# Patient Record
Sex: Female | Born: 1980 | Race: Black or African American | Hispanic: No | Marital: Married | Smoking: Never smoker
Health system: Southern US, Community
[De-identification: ages and names within clinical notes are randomized; demographics above are authoritative.]

---

## 2011-06-03 ENCOUNTER — Other Ambulatory Visit (HOSPITAL_COMMUNITY)
Admission: RE | Admit: 2011-06-03 | Discharge: 2011-06-03 | Disposition: A | Discharge status: Home / Self Care | Payer: Managed Care, Other (non HMO) | Source: Ambulatory Visit | Attending: Obstetrics and Gynecology | Admitting: Obstetrics and Gynecology

## 2011-06-03 DIAGNOSIS — Z124 Encounter for screening for malignant neoplasm of cervix: Secondary | ICD-10-CM | POA: Insufficient documentation

## 2012-03-27 ENCOUNTER — Encounter (HOSPITAL_BASED_OUTPATIENT_CLINIC_OR_DEPARTMENT_OTHER): Payer: Self-pay | Admitting: *Deleted

## 2012-03-27 ENCOUNTER — Emergency Department (HOSPITAL_BASED_OUTPATIENT_CLINIC_OR_DEPARTMENT_OTHER)
Admission: EM | Admit: 2012-03-27 | Discharge: 2012-03-28 | Disposition: A | Discharge status: Home / Self Care | Payer: Managed Care, Other (non HMO) | Attending: Emergency Medicine | Admitting: Emergency Medicine

## 2012-03-27 DIAGNOSIS — R509 Fever, unspecified: Secondary | ICD-10-CM | POA: Insufficient documentation

## 2012-03-27 NOTE — ED Notes (Signed)
Pt. Reports feeling bad since Thurs.  No reports of nausea vomiting or diarrhea.

## 2012-03-28 ENCOUNTER — Emergency Department (HOSPITAL_BASED_OUTPATIENT_CLINIC_OR_DEPARTMENT_OTHER): Payer: Managed Care, Other (non HMO)

## 2012-03-28 LAB — BASIC METABOLIC PANEL
BUN: 10 mg/dL (ref 6–23)
GFR calc Af Amer: >90 mL/min (ref >90)
GFR calc non Af Amer: >90 mL/min (ref >90)
Potassium: 4.2 mEq/L (ref 3.5–5.1)

## 2012-03-28 LAB — CBC WITH DIFFERENTIAL/PLATELET
Basophils Absolute: 0 10*3/uL (ref 0.0–0.1)
Basophils Relative: 0 % (ref 0–1)
Eosinophils Absolute: 0.4 10*3/uL (ref 0.0–0.7)
Hemoglobin: 13.2 g/dL (ref 12.0–15.0)
MCH: 32.8 pg (ref 26.0–34.0)
MCHC: 35.4 g/dL (ref 30.0–36.0)
Monocytes Relative: 10 % (ref 3–12)
Neutrophils Relative %: 45 % (ref 43–77)
Platelets: 167 10*3/uL (ref 150–400)
RDW: 11.5 % (ref 11.5–15.5)

## 2012-03-28 LAB — URINE MICROSCOPIC-ADD ON

## 2012-03-28 LAB — URINALYSIS, ROUTINE W REFLEX MICROSCOPIC
Ketones, ur: NEGATIVE mg/dL
Nitrite: NEGATIVE
Protein, ur: NEGATIVE mg/dL
Urobilinogen, UA: 1 mg/dL (ref 0.0–1.0)

## 2012-03-28 LAB — ROCKY MTN SPOTTED FVR AB, IGM-BLOOD: RMSF IgM: 0.25 IV (ref 0.00–0.89)

## 2012-03-28 MED ORDER — SODIUM CHLORIDE 0.9 % IV BOLUS (SEPSIS)
1000.0000 mL | Freq: Once | INTRAVENOUS | Status: AC
  Administered 2012-03-28: 1000 mL via INTRAVENOUS

## 2012-03-28 MED ORDER — IBUPROFEN 800 MG PO TABS
800.0000 mg | ORAL_TABLET | Freq: Once | ORAL | Status: AC
  Administered 2012-03-28: 800 mg via ORAL
  Filled 2012-03-28: qty 1

## 2012-03-28 MED ORDER — ACETAMINOPHEN 325 MG PO TABS
650.0000 mg | ORAL_TABLET | Freq: Once | ORAL | Status: AC
  Administered 2012-03-28: 650 mg via ORAL
  Filled 2012-03-28: qty 2

## 2012-03-28 MED ORDER — DOXYCYCLINE HYCLATE 100 MG PO TABS
100.0000 mg | ORAL_TABLET | Freq: Once | ORAL | Status: AC
  Administered 2012-03-28: 100 mg via ORAL
  Filled 2012-03-28: qty 1

## 2012-03-28 MED ORDER — DOXYCYCLINE HYCLATE 100 MG PO TABS: 100.0000 mg | ORAL_TABLET | Freq: Two times a day (BID) | ORAL | Status: AC

## 2012-03-28 NOTE — ED Provider Notes (Signed)
History     CSN: [id number]  Arrival date & time 03/27/12  2336   First MD Initiated Contact with Patient 03/27/12 2341      Chief Complaint  Patient presents with  . Generalized Body Aches    Pt. reports pain all over and has chills with reports she saw her family MD for same.  Pt. reports hives from Diflonc Sodium    (Consider location/radiation/quality/duration/timing/severity/associated sxs/prior treatment) HPI Pt reports 4 days of intermittent fever and moderate to severe generalized body aches and headache. She went to her PCP for same 2 days ago and had neg strep and influenza tests. Given Voltaren for symptoms which helped some but caused a hives rash. She took some benadryl and the hives went away. She denies any URI symptoms, no cough, SOB, N/V/D, dysuria or vaginal discharge.   History reviewed. No pertinent past medical history.  History reviewed. No pertinent past surgical history.  No family history on file.  History  Substance Use Topics  . Smoking status: Never Smoker   . Smokeless tobacco: Not on file  . Alcohol Use: Yes    OB History    Grav Para Term Preterm Abortions TAB SAB Ect Mult Living                  Review of Systems All other systems reviewed and are negative except as noted in HPI.   Allergies  Diclofenac; Erythromycin; and Hydrocodone  Home Medications   Current Outpatient Rx  Name Route Sig Dispense Refill  . DIPHENHYDRAMINE HCL 25 MG PO TABS Oral Take 25 mg by mouth every 6 (six) hours as needed.    Marland Kitchen LEVONORGEST-ETH ESTRAD 91-DAY 0.15-0.03 MG PO TABS Oral Take 1 tablet by mouth daily.      BP 141/84  Pulse 107  Temp 101.1 F (38.4 C) (Oral)  Resp 20  Ht 5\' 5"  (1.651 m)  Wt 220 lb (99.791 kg)  BMI 36.61 kg/m2  SpO2 100%  LMP 02/21/2012  Physical Exam  Nursing note and vitals reviewed. Constitutional: She is oriented to person, place, and time. She appears well-developed and well-nourished.  HENT:  Head: Normocephalic  and atraumatic.  Eyes: EOM are normal. Pupils are equal, round, and reactive to light.  Neck: Normal range of motion. Neck supple.       No meningismus  Cardiovascular: Normal rate, normal heart sounds and intact distal pulses.   Pulmonary/Chest: Effort normal and breath sounds normal.  Abdominal: Bowel sounds are normal. She exhibits no distension. There is no tenderness.  Musculoskeletal: Normal range of motion. She exhibits no edema and no tenderness.  Neurological: She is alert and oriented to person, place, and time. She has normal strength. No cranial nerve deficit or sensory deficit.  Skin: Skin is warm and dry. No rash noted.  Psychiatric: She has a normal mood and affect.    ED Course  Procedures (including critical care time)  Labs Reviewed  URINALYSIS, ROUTINE W REFLEX MICROSCOPIC - Abnormal; Notable for the following:    APPearance CLOUDY (*)     Leukocytes, UA SMALL (*)     All other components within normal limits  CBC WITH DIFFERENTIAL - Abnormal; Notable for the following:    WBC 3.4 (*)     Neutro Abs 1.5 (*)     Eosinophils Relative 11 (*)     All other components within normal limits  BASIC METABOLIC PANEL - Abnormal; Notable for the following:    Sodium 133 (*)  Glucose, Bld 112 (*)     Calcium 8.3 (*)     All other components within normal limits  URINE MICROSCOPIC-ADD ON - Abnormal; Notable for the following:    Squamous Epithelial / LPF FEW (*)     Bacteria, UA FEW (*)     All other components within normal limits  PREGNANCY, URINE  CK  ROCKY MTN SPOTTED FVR AB, IGG-BLOOD  ROCKY MTN SPOTTED FVR AB, IGM-BLOOD   Dg Chest 2 View  03/28/2012  *RADIOLOGY REPORT*  Clinical Data: Body aches and fever.  CHEST - 2 VIEW  Comparison: None.  Findings: The heart size and mediastinal contours are normal. The lungs are clear. There is no pleural effusion or pneumothorax. No acute osseous findings are identified.  IMPRESSION: No active cardiopulmonary process.    Original Report Authenticated By: Gerrianne Scale, M.D.      No diagnosis found.    MDM  Fever and headache without clear source in summertime. Consider RMSF, titers sent but will treat empirically. Pt given Motrin in controlled environment without reaction. Doubt true NSAID allergy.        Charles B. Bernette Mayers, MD 03/28/12 [phone]

## 2012-04-06 ENCOUNTER — Encounter (HOSPITAL_BASED_OUTPATIENT_CLINIC_OR_DEPARTMENT_OTHER): Payer: Self-pay | Admitting: Emergency Medicine

## 2012-04-06 ENCOUNTER — Emergency Department (HOSPITAL_BASED_OUTPATIENT_CLINIC_OR_DEPARTMENT_OTHER)
Admission: EM | Admit: 2012-04-06 | Discharge: 2012-04-06 | Disposition: A | Discharge status: Other Acute Inpatient Hospital | Payer: Managed Care, Other (non HMO) | Attending: Emergency Medicine | Admitting: Emergency Medicine

## 2012-04-06 ENCOUNTER — Emergency Department (HOSPITAL_BASED_OUTPATIENT_CLINIC_OR_DEPARTMENT_OTHER): Payer: Managed Care, Other (non HMO)

## 2012-04-06 DIAGNOSIS — A419 Sepsis, unspecified organism: Secondary | ICD-10-CM

## 2012-04-06 DIAGNOSIS — H109 Unspecified conjunctivitis: Secondary | ICD-10-CM | POA: Insufficient documentation

## 2012-04-06 LAB — COMPREHENSIVE METABOLIC PANEL
Alkaline Phosphatase: 69 U/L (ref 39–117)
BUN: 13 mg/dL (ref 6–23)
Chloride: 103 mEq/L (ref 96–112)
GFR calc Af Amer: >90 mL/min (ref >90)
Glucose, Bld: 143 mg/dL — ABNORMAL HIGH (ref 70–99)
Potassium: 3 mEq/L — ABNORMAL LOW (ref 3.5–5.1)
Total Bilirubin: 0.5 mg/dL (ref 0.3–1.2)

## 2012-04-06 LAB — URINALYSIS, ROUTINE W REFLEX MICROSCOPIC
Glucose, UA: NEGATIVE mg/dL
Ketones, ur: 15 mg/dL — AB
Nitrite: NEGATIVE
Protein, ur: NEGATIVE mg/dL

## 2012-04-06 LAB — CBC WITH DIFFERENTIAL/PLATELET
Hemoglobin: 14.3 g/dL (ref 12.0–15.0)
Lymphs Abs: 0.2 10*3/uL — ABNORMAL LOW (ref 0.7–4.0)
Monocytes Relative: 3 % (ref 3–12)
Neutro Abs: 7.7 10*3/uL (ref 1.7–7.7)
Neutrophils Relative %: 95 % — ABNORMAL HIGH (ref 43–77)
RBC: 4.39 MIL/uL (ref 3.87–5.11)

## 2012-04-06 LAB — URINE MICROSCOPIC-ADD ON

## 2012-04-06 LAB — WET PREP, GENITAL: Clue Cells Wet Prep HPF POC: NONE SEEN

## 2012-04-06 LAB — LACTIC ACID, PLASMA: Lactic Acid, Venous: 3.5 mmol/L — ABNORMAL HIGH (ref 0.5–2.2)

## 2012-04-06 LAB — CK: Total CK: 86 U/L (ref 7–177)

## 2012-04-06 LAB — MONONUCLEOSIS SCREEN: Mono Screen: NEGATIVE

## 2012-04-06 LAB — PREGNANCY, URINE: Preg Test, Ur: NEGATIVE

## 2012-04-06 LAB — RAPID STREP SCREEN (MED CTR MEBANE ONLY): Streptococcus, Group A Screen (Direct): NEGATIVE

## 2012-04-06 MED ORDER — IBUPROFEN 800 MG PO TABS
800.0000 mg | ORAL_TABLET | Freq: Once | ORAL | Status: AC
  Administered 2012-04-06: 800 mg via ORAL
  Filled 2012-04-06: qty 1

## 2012-04-06 MED ORDER — VANCOMYCIN HCL IN DEXTROSE 1-5 GM/200ML-% IV SOLN
1000.0000 mg | Freq: Once | INTRAVENOUS | Status: AC
  Administered 2012-04-06: 1000 mg via INTRAVENOUS
  Filled 2012-04-06: qty 200

## 2012-04-06 MED ORDER — POTASSIUM CHLORIDE 10 MEQ/100ML IV SOLN
10.0000 meq | Freq: Once | INTRAVENOUS | Status: AC
  Administered 2012-04-06: 10 meq via INTRAVENOUS
  Filled 2012-04-06: qty 100

## 2012-04-06 MED ORDER — SODIUM CHLORIDE 0.9 % IV BOLUS (SEPSIS)
1000.0000 mL | Freq: Once | INTRAVENOUS | Status: AC
  Administered 2012-04-06: 1000 mL via INTRAVENOUS

## 2012-04-06 MED ORDER — PIPERACILLIN-TAZOBACTAM 3.375 G IVPB
3.3750 g | Freq: Once | INTRAVENOUS | Status: AC
  Administered 2012-04-06: 3.375 g via INTRAVENOUS
  Filled 2012-04-06: qty 50

## 2012-04-06 NOTE — ED Notes (Signed)
Patient with generalized aches and malaise, presented last week with same symptoms, started on doxy  For ? Tick bite, pt had rash to doxy and was discontinued by current pcp,

## 2012-04-06 NOTE — ED Provider Notes (Signed)
History     CSN: [id number]  Arrival date & time 04/06/12  0530   First MD Initiated Contact with Patient 04/06/12 0546      No chief complaint on file.   (Consider location/radiation/quality/duration/timing/severity/associated sxs/prior treatment) Patient is a 31 y.o. female presenting with musculoskeletal pain and pharyngitis. The history is provided by the patient.  Muscle Pain This is a new problem. The current episode started more than 1 week ago (2 weeks ago). The problem occurs constantly. Progression since onset: got better then got worse again.  Tested for RMSF, Flu and strep without source. Pertinent negatives include no chest pain, no abdominal pain and no shortness of breath. Nothing aggravates the symptoms. Nothing relieves the symptoms. Treatments tried: Has had doxycycline, voltaren and bactrim all which made her sick. The treatment provided no relief.  Sore Throat This is a new problem. The current episode started 6 to 12 hours ago. The problem occurs constantly. The problem has not changed since onset.Pertinent negatives include no chest pain, no abdominal pain and no shortness of breath. Nothing aggravates the symptoms. Nothing relieves the symptoms. She has tried nothing for the symptoms. The treatment provided no relief.  Today has a mild HA.  But has a daily headaches and this is mild in comparison to previous. Pt reports this am sclera are injected and has mild sore throat.  No vaginal symptom.  No CP.  No rashes since stopping voltaren.  No vomiting.  No diarrhea.  No abdominal or vaginal complaints.  No joint swelling.  No tick exposure.  No travel history.  No family h/o of hematologic problems.    No past medical history on file.  No past surgical history on file.  No family history on file.  History  Substance Use Topics  . Smoking status: Never Smoker   . Smokeless tobacco: Not on file  . Alcohol Use: Yes    OB History    Grav Para Term Preterm Abortions  TAB SAB Ect Mult Living                  Review of Systems  Constitutional: Negative for fever.  HENT: Positive for sore throat. Negative for facial swelling, neck pain and neck stiffness.   Eyes: Positive for redness. Negative for photophobia.  Respiratory: Negative for cough and shortness of breath.   Cardiovascular: Negative for chest pain.  Gastrointestinal: Negative for nausea, vomiting, abdominal pain and diarrhea.  Genitourinary: Negative for dysuria, vaginal discharge and pelvic pain.  Skin: Negative for color change and rash.  Neurological: Negative for weakness and light-headedness.  Hematological: Negative for adenopathy.  All other systems reviewed and are negative.    Allergies  Diclofenac; Erythromycin; and Hydrocodone  Home Medications   Current Outpatient Rx  Name Route Sig Dispense Refill  . DIPHENHYDRAMINE HCL 25 MG PO TABS Oral Take 25 mg by mouth every 6 (six) hours as needed.    Marland Kitchen DOXYCYCLINE HYCLATE 100 MG PO TABS Oral Take 1 tablet (100 mg total) by mouth 2 (two) times daily. 20 tablet 0  . LEVONORGEST-ETH ESTRAD 91-DAY 0.15-0.03 MG PO TABS Oral Take 1 tablet by mouth daily.      BP 101/58  Pulse 140  Temp 99.4 F (37.4 C) (Oral)  Resp 18  Ht 5\' 5"  (1.651 m)  Wt 220 lb (99.791 kg)  BMI 36.61 kg/m2  SpO2 100%  LMP 02/21/2012  Physical Exam  Constitutional: She is oriented to person, place, and time. She appears  well-developed and well-nourished.  HENT:  Head: Normocephalic and atraumatic.  Mouth/Throat: Oropharynx is clear and moist.  Eyes: Pupils are equal, round, and reactive to light. Right conjunctiva is injected. Left conjunctiva is injected.  Neck: Normal range of motion. Neck supple.  Cardiovascular: Regular rhythm.  Tachycardia present.   Pulmonary/Chest: Effort normal. She has no wheezes. She exhibits no tenderness.  Abdominal: Soft. Bowel sounds are normal. There is no tenderness.  Musculoskeletal: Normal range of motion. She  exhibits no edema.  Lymphadenopathy:    She has no cervical adenopathy.  Neurological: She is alert and oriented to person, place, and time. She has normal reflexes.  Skin: Skin is warm and dry.  Psychiatric: She has a normal mood and affect.    ED Course  Procedures (including critical care time)   Labs Reviewed  CBC WITH DIFFERENTIAL  COMPREHENSIVE METABOLIC PANEL  URINALYSIS, ROUTINE W REFLEX MICROSCOPIC  URINE CULTURE  CULTURE, BLOOD (ROUTINE X 2)  CULTURE, BLOOD (ROUTINE X 2)  LACTIC ACID, PLASMA  MONONUCLEOSIS SCREEN  RAPID STREP SCREEN   No results found.   No diagnosis found.    MDM  MDM Reviewed: previous chart, nursing note and vitals Reviewed previous: labs and x-ray Interpretation: labs, ECG and x-ray Total time providing critical care: 30-74 minutes. This excludes time spent performing separately reportable procedures and services. Consults: admitting MD  CRITICAL CARE   Performed by: Jasmine Awe   Total critical care time: 60 minutes  Critical care time was exclusive of separately billable procedures and treating other patients.  Critical care was necessary to treat or prevent imminent or life-threatening deterioration.  Critical care was time spent personally by me on the following activities: development of treatment plan with patient and/or surrogate as well as nursing, discussions with consultants, evaluation of patient's response to treatment, examination of patient, obtaining history from patient or surrogate, ordering and performing treatments and interventions, ordering and review of laboratory studies, ordering and review of radiographic studies, pulse oximetry and re-evaluation of patient's condition.     DDX: occult bacteremia from bacteria source, viral infection.  Suspect rheumatologic process that is undiagnosed.  Will need admission IVF and abx until cultures negative   ED EKG . Date: 04/06/2012  Rate: 135  Rhythm:  sinus tachycardia  QRS Axis: normal  Intervals: QT prolonged  ST/T Wave abnormalities: nonspecific T wave changes  Conduction Disutrbances:none  Narrative Interpretation:   Old EKG Reviewed: none available  RMSF labs sent on last visit and were below .9 level  Blood cultures sent x 2.  Added CK per Dr. Linard Millers.  Vancomycin and zosyn IV initiated.  Bolus 3 liters IVF.  No QT prolonging agents d/t QTc prolongation.       Jasmine Awe, MD 04/06/12 [phone]

## 2012-04-08 LAB — URINE CULTURE: Colony Count: NO GROWTH

## 2012-04-10 ENCOUNTER — Telehealth: Payer: Self-pay | Admitting: Hematology & Oncology

## 2012-04-10 NOTE — Telephone Encounter (Signed)
Pt made 04-17-12 appointment

## 2012-04-10 NOTE — Telephone Encounter (Signed)
Let message for pt to call and schedule appointment

## 2012-04-12 LAB — CULTURE, BLOOD (ROUTINE X 2): Culture: NO GROWTH

## 2012-04-18 ENCOUNTER — Ambulatory Visit: Payer: Managed Care, Other (non HMO)

## 2012-04-18 ENCOUNTER — Ambulatory Visit: Payer: Managed Care, Other (non HMO) | Admitting: Medical

## 2012-04-18 ENCOUNTER — Ambulatory Visit: Payer: Managed Care, Other (non HMO) | Admitting: Lab

## 2012-04-19 ENCOUNTER — Telehealth: Payer: Self-pay | Admitting: Hematology & Oncology

## 2012-04-19 NOTE — Telephone Encounter (Signed)
Left pt message to call if she wants to reschedule missed 9-24 appointment

## 2012-05-10 ENCOUNTER — Telehealth: Payer: Self-pay | Admitting: Hematology & Oncology

## 2012-05-10 NOTE — Telephone Encounter (Signed)
Pt called schedule 05-18-12 appointment

## 2012-05-16 ENCOUNTER — Other Ambulatory Visit: Payer: Self-pay | Admitting: Medical

## 2012-05-18 ENCOUNTER — Ambulatory Visit: Payer: Managed Care, Other (non HMO) | Admitting: Medical

## 2012-05-18 ENCOUNTER — Ambulatory Visit: Payer: Managed Care, Other (non HMO)

## 2012-05-18 ENCOUNTER — Other Ambulatory Visit: Payer: Managed Care, Other (non HMO) | Admitting: Lab

## 2012-05-22 ENCOUNTER — Other Ambulatory Visit: Payer: Managed Care, Other (non HMO) | Admitting: Lab

## 2012-05-22 ENCOUNTER — Ambulatory Visit: Payer: Managed Care, Other (non HMO)

## 2012-05-22 ENCOUNTER — Ambulatory Visit (HOSPITAL_BASED_OUTPATIENT_CLINIC_OR_DEPARTMENT_OTHER): Payer: Managed Care, Other (non HMO) | Admitting: Medical

## 2012-05-22 VITALS — BP 117/68 | HR 79 | Temp 98.1°F | Resp 16 | Ht 68.0 in | Wt 215.0 lb

## 2012-05-22 DIAGNOSIS — D72819 Decreased white blood cell count, unspecified: Secondary | ICD-10-CM

## 2012-05-22 LAB — COMPREHENSIVE METABOLIC PANEL
Albumin: 3.8 g/dL (ref 3.5–5.2)
BUN: 11 mg/dL (ref 6–23)
Calcium: 9.2 mg/dL (ref 8.4–10.5)
Chloride: 107 mEq/L (ref 96–112)
Glucose, Bld: 88 mg/dL (ref 70–99)
Potassium: 4 mEq/L (ref 3.5–5.3)
Sodium: 139 mEq/L (ref 135–145)
Total Protein: 6.8 g/dL (ref 6.0–8.3)

## 2012-05-22 LAB — CBC WITH DIFFERENTIAL (CANCER CENTER ONLY)
BASO#: 0 10*3/uL (ref 0.0–0.2)
Eosinophils Absolute: 0.3 10*3/uL (ref 0.0–0.5)
HCT: 38.6 % (ref 34.8–46.6)
HGB: 13.5 g/dL (ref 11.6–15.9)
LYMPH#: 1.1 10*3/uL (ref 0.9–3.3)
MONO#: 0.6 10*3/uL (ref 0.1–0.9)
NEUT%: 39 % — ABNORMAL LOW (ref 39.6–80.0)
RBC: 4.08 10*6/uL (ref 3.70–5.32)
WBC: 3.2 10*3/uL — ABNORMAL LOW (ref 3.9–10.0)

## 2012-05-22 NOTE — Progress Notes (Signed)
This office note has been dictated.

## 2012-05-22 NOTE — H&P (Signed)
[facility] NEW PATIENT EVALUATION   Name: Jessica Mcgrath Date: 05/22/2012 MRN: [id number] DOB: [birth year]    REFERRING PHYSICIAN: Palumbo-Rasch, April, MD  REASON FOR REFERRAL:  Leukopenia   HISTORY OF PRESENT ILLNESS:Jessica Mcgrath is a 31 y.o. female who was kindly referred to [place] by [facility].  This is a pleasant, African American, female, who really does not have any pertinent past medical or surgical history.  Overall, she, reports, that she is in good health.  She did have a short hospital admission.  Back on September 13 at [facility] for possible sepsis.  She reports, that she presented to the emergency room with severe, achiness, and fever.  She reports, that they tested her for the flu, and it was negative.  She was then [facility] for admission.  I do not have those records, however, from what the patient tells me all workup was negative.  She's not reported any recent upper respiratory infections.  She's not had any type of unintentional weight loss.  She's not reporting any type of bony pain, or abdominal pain.  She denies any fevers, chills, or night sweats.  Any cough, chest pain, shortness of breath.  She denies any nausea, vomiting, diarrhea, or constipation.  She denies any type of dysphasia or odynophagia.  She denies any headaches, visual changes, or rashes. I did explain to the, patient that about 10-15% of the African American population has a low white count.  PAST MEDICAL HISTORY:  No pertinent PMH  PAST SURGICAL HISTORY: No pertinent PSH  ALLERGIES: Diclofenac; Erythromycin; and Hydrocodone  SOCIAL HISTORY: She is married with 3 children.  She works part-time at [employer].  She denies any tobacco or illicit drug use.  She is a social drinker  FAMILY HISTORY: Non-contributory  Current Outpatient Prescriptions on File Prior to Visit  Medication Sig Dispense Refill  . levonorgestrel-ethinyl estradiol  (SEASONALE,INTROVALE,JOLESSA) 0.15-0.03 MG tablet Take 1 tablet by mouth daily.        LABORATORY DATA:  Results for orders placed in visit on 05/22/12 (from the past 48 hour(s))  CBC WITH DIFFERENTIAL ([facility])     Status: Abnormal   Collection Time   05/22/12 11:48 AM      Component Value Range Comment   WBC 3.2 (*) 3.9 - 10.0 10e3/uL    RBC 4.08  3.70 - 5.32 10e6/uL    HGB 13.5  11.6 - 15.9 g/dL    HCT 29.5  62.1 - 30.8 %    MCV 95  81 - 101 fL    MCH 33.1  26.0 - 34.0 pg    MCHC 35.0  32.0 - 36.0 g/dL    RDW 65.7  84.6 - 96.2 %    Platelets 215  145 - 400 10e3/uL    NEUT# 1.3 (*) 1.5 - 6.5 10e3/uL    LYMPH# 1.1  0.9 - 3.3 10e3/uL    MONO# 0.6  0.1 - 0.9 10e3/uL    Eosinophils Absolute 0.3  0.0 - 0.5 10e3/uL    BASO# 0.0  0.0 - 0.2 10e3/uL    NEUT% 39.0 (*) 39.6 - 80.0 %    LYMPH% 33.8  14.0 - 48.0 %    MONO% 19.1 (*) 0.0 - 13.0 %    EOS% 7.8 (*) 0.0 - 7.0 %    BASO% 0.3  0.0 - 2.0 %   [facility] - SMEAR     Status: Normal   Collection Time  05/22/12 11:48 AM      Component Value Range Comment   Smear Result Smear Available        Peripheral smear  shows a well defined population of red blood cells. There is no  polychromasia. I do not see any nucleated red blood cells. There are  no schistocytes. She had no rouleaux formation. I saw no target cells.  There are no sickle cells. Her white cells appear normal in morphology  and maturation. She has a few large lymphocytes. She has no hypersegmented polys. There are no immature  myeloid or lymphoid forms.  I do not see any blasts. Platelets were well-  Granulated and normal size.  Review of Systems: Pt. Denies any changes in their vision, hearing, adenopathy, fevers, chills, nausea, vomiting, diarrhea, constipation, chest pain, shortness of breath, passing blood, passing out, blacking out,  any changes in skin, joints, neurologic or psychiatric except as noted.  Physical Exam: This is a pleasant,  well-developed, well-nourished, 31 year old, African American female, in no obvious distress Vitals: Temperature 98.1 degrees, pulse 79, respirations 16, blood pressure 117/68, weight 215 pounds HEENT reveals a normocephalic, atraumatic skull, no scleral icterus, no oral lesions  Neck is supple without any cervical or supraclavicular adenopathy.  Lungs are clear to auscultation bilaterally. There are no wheezes, rales or rhonci Cardiac is regular rate and rhythm with a normal S1 and S2. There are no murmurs, rubs, or bruits.  Abdomen is soft with good bowel sounds, there is no palpable mass. There is no palpable hepatosplenomegaly. There is no palpable fluid wave.  Musculoskeletal no tenderness of the spine, ribs, or hips.  Extremities there are no clubbing, cyanosis, or edema.  Skin no petechia, purpura or ecchymosis Neurologic is nonfocal.  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  There does not appear to be any type of infectious or autoimmune underlying issues.  She is not symptomatic.  She has not had any recurrent pulmonary infections.  Again, her peripheral smear was normal.  We will plan to follow back up with her to recheck his CBC.  #2 followup.  We will follow back up with Jessica Mcgrath in 3 months, but before then should there be questions or concerns.  Dr. Myna Hidalgo personally saw and examined.  This patient.  The above assessment and plan was discussed and agreed upon with Dr. Myna Hidalgo.

## 2012-08-21 ENCOUNTER — Other Ambulatory Visit: Payer: Managed Care, Other (non HMO) | Admitting: Lab

## 2012-08-21 ENCOUNTER — Ambulatory Visit: Payer: Managed Care, Other (non HMO) | Admitting: Hematology & Oncology

## 2012-08-22 ENCOUNTER — Telehealth: Payer: Self-pay | Admitting: Hematology & Oncology

## 2012-08-22 NOTE — Telephone Encounter (Signed)
Left pt message to call and reschedule missed 12-7 appointment °

## 2013-12-13 IMAGING — CR DG CHEST 2V
2 series · 2 of 2 positions shown · non-contrast
Comparison: None.

CLINICAL DATA: Body aches and fever.

CHEST - 2 VIEW

[w chest pa]
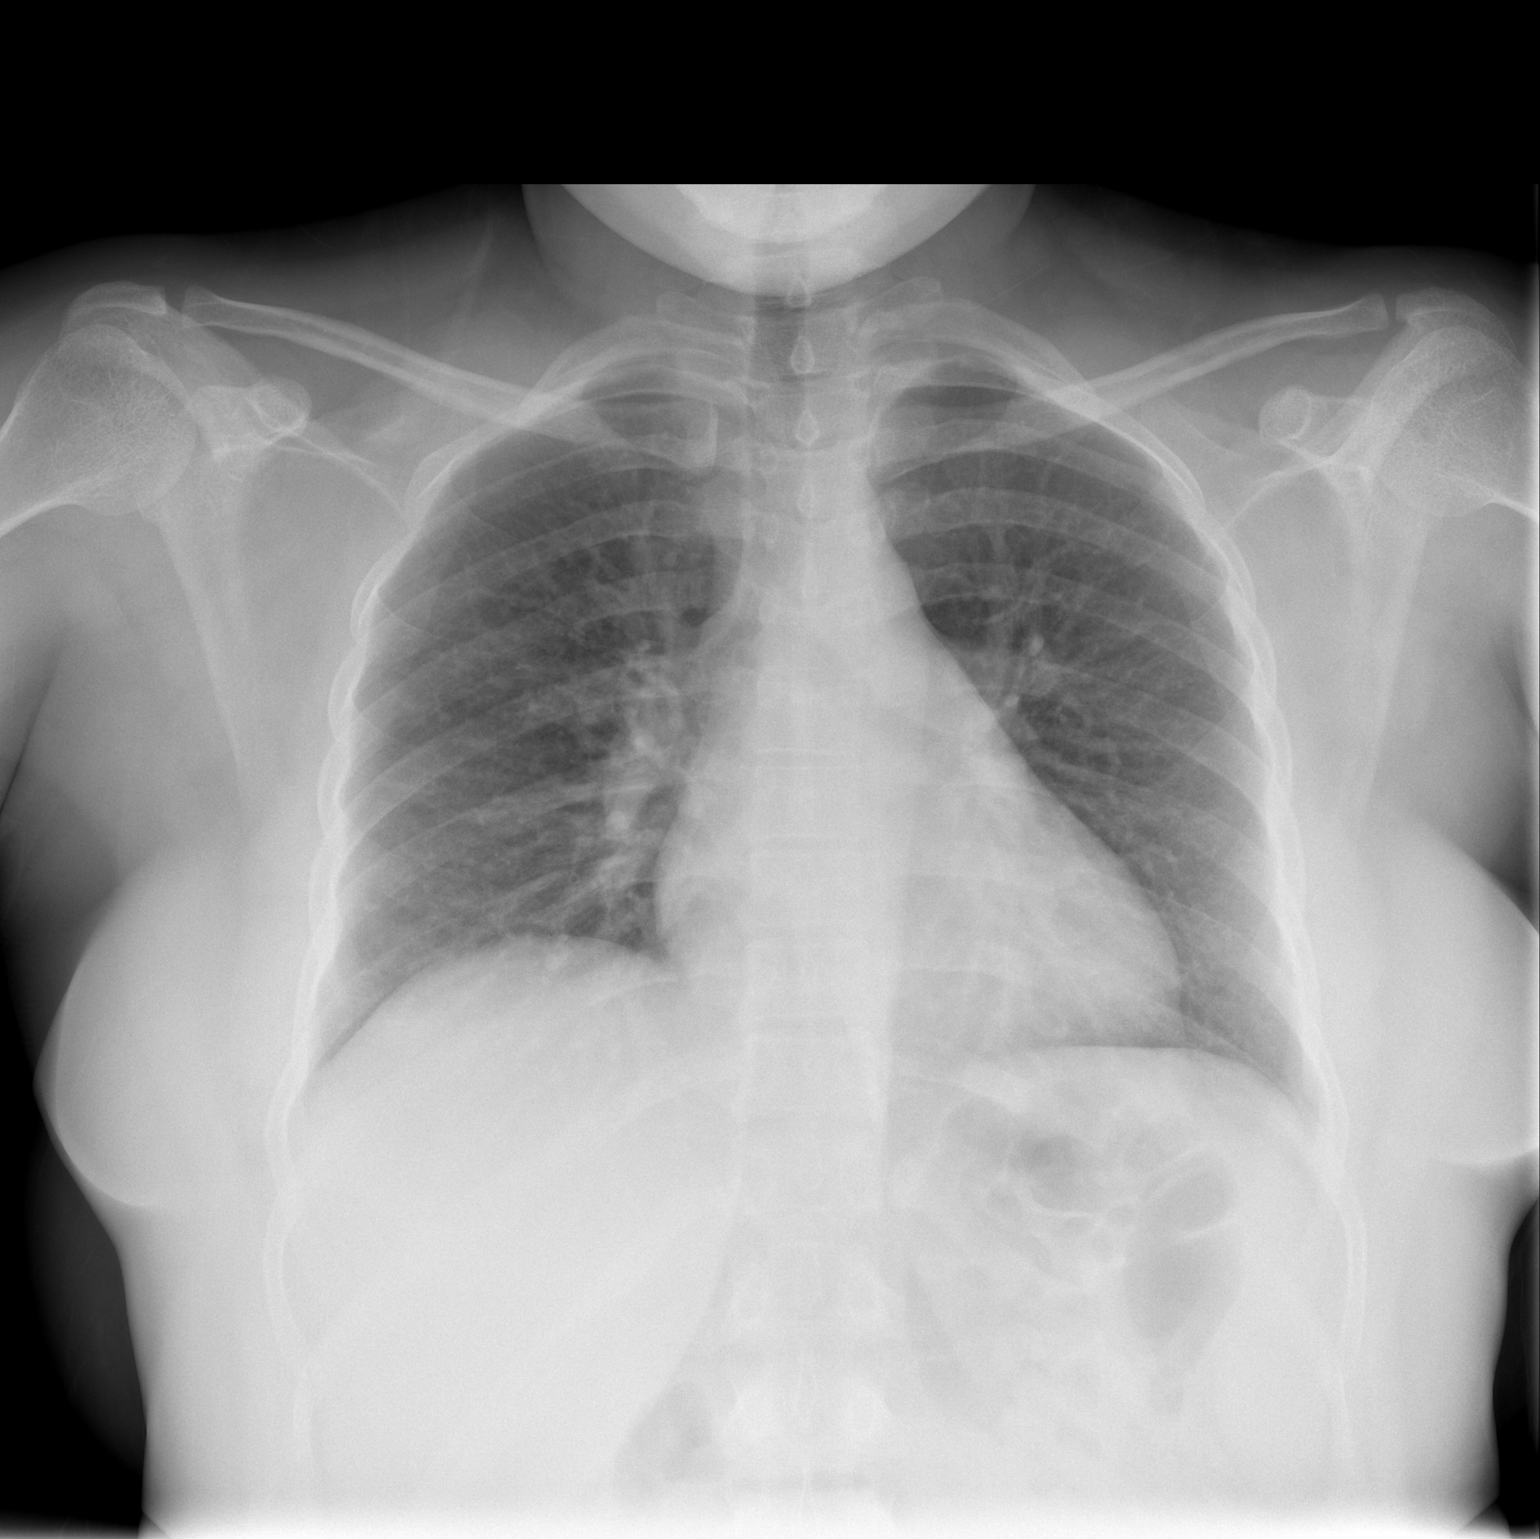

[w chest lat]
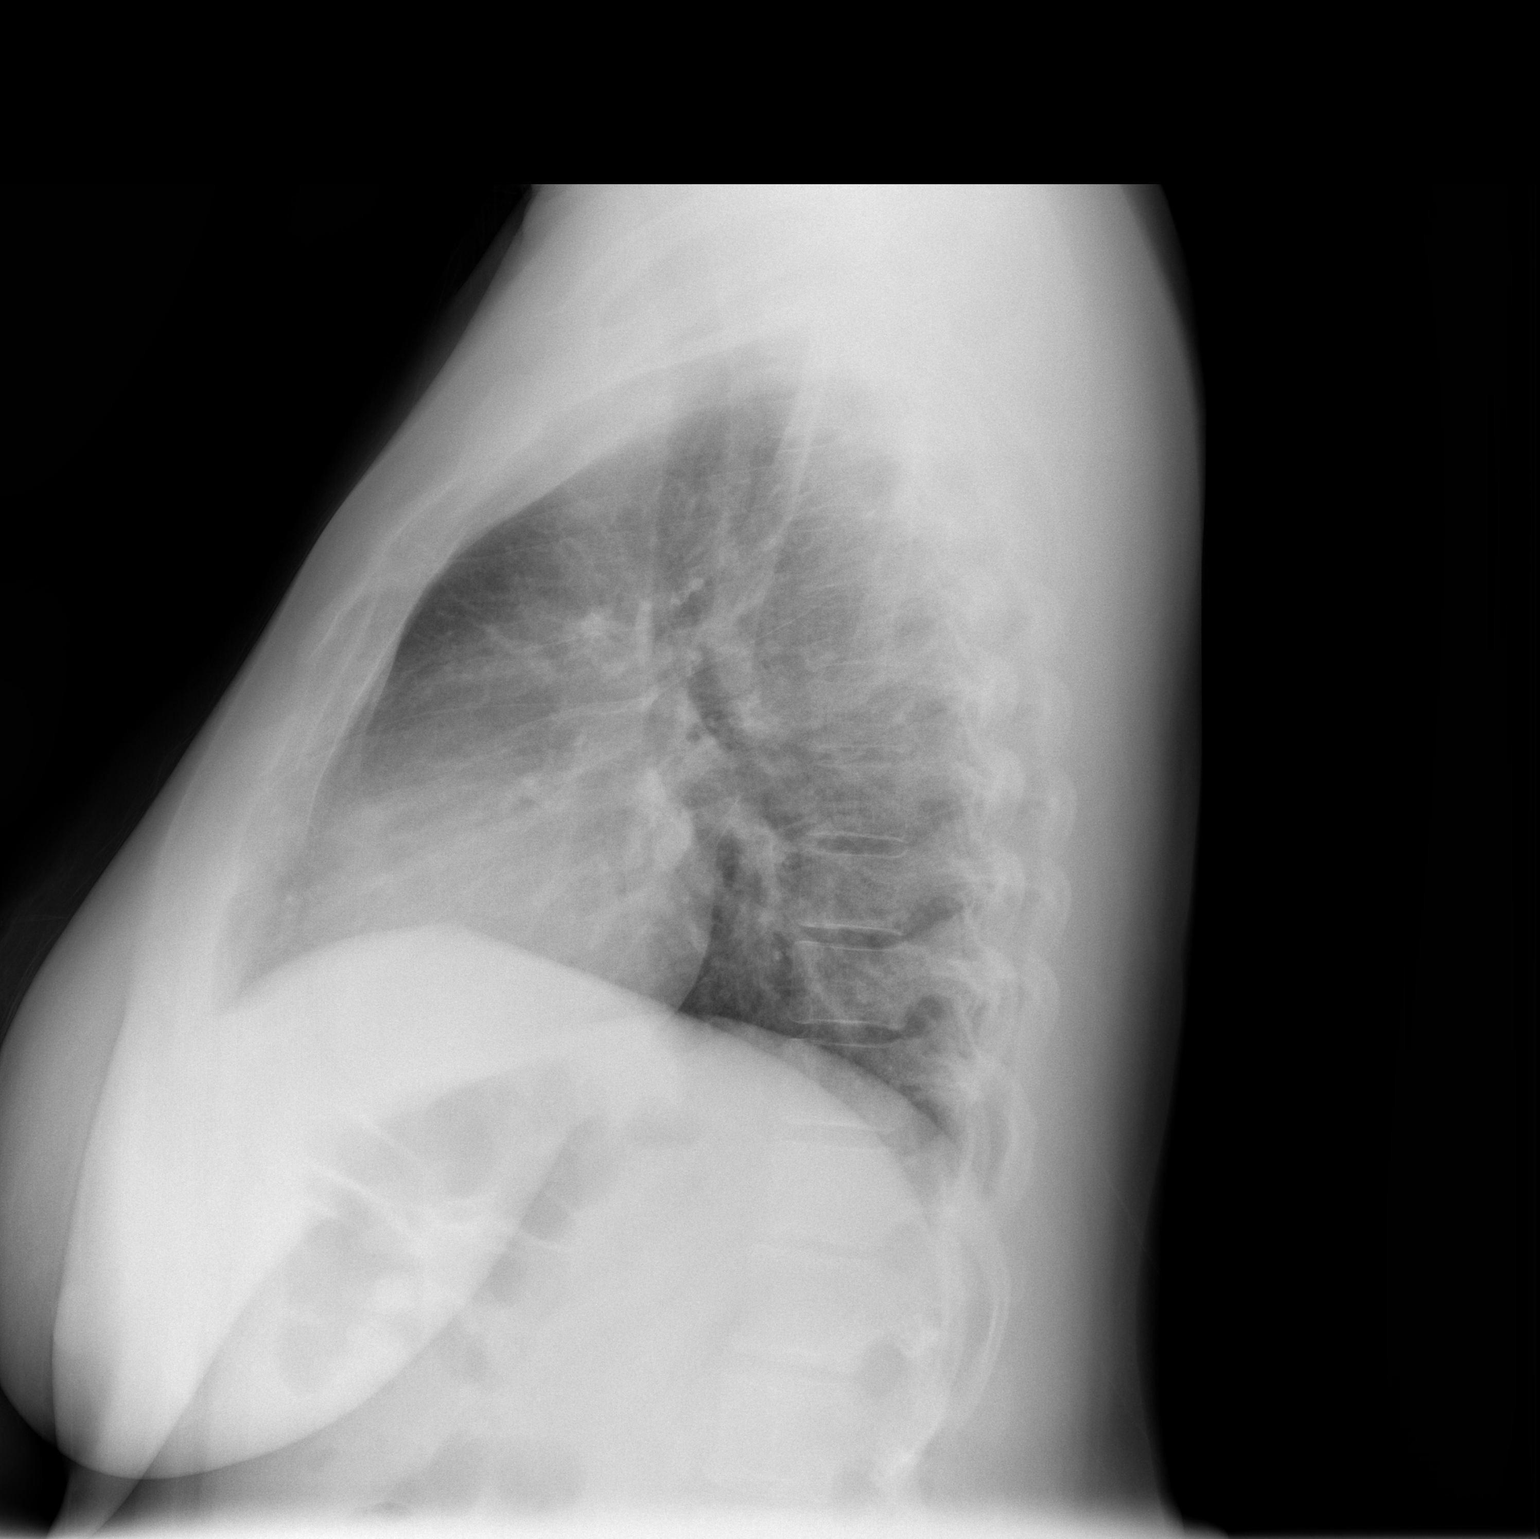

[2 of 2 positions shown; findings below may reference images not displayed]

FINDINGS: The heart size and mediastinal contours are normal. The
lungs are clear. There is no pleural effusion or pneumothorax. No
acute osseous findings are identified.
IMPRESSION: No active cardiopulmonary process.

## 2016-08-09 ENCOUNTER — Ambulatory Visit (INDEPENDENT_AMBULATORY_CARE_PROVIDER_SITE_OTHER): Payer: Managed Care, Other (non HMO) | Admitting: Physician Assistant

## 2016-08-09 ENCOUNTER — Ambulatory Visit (INDEPENDENT_AMBULATORY_CARE_PROVIDER_SITE_OTHER): Payer: Managed Care, Other (non HMO)

## 2016-08-09 VITALS — BP 118/80 | HR 77 | Temp 98.2°F | Resp 16 | Ht 67.0 in | Wt 208.0 lb

## 2016-08-09 DIAGNOSIS — R2233 Localized swelling, mass and lump, upper limb, bilateral: Secondary | ICD-10-CM

## 2016-08-09 DIAGNOSIS — Z0000 Encounter for general adult medical examination without abnormal findings: Secondary | ICD-10-CM

## 2016-08-09 NOTE — Progress Notes (Signed)
08/09/2016 11:27 AM   DOB: [birth year] / MRN: [id number]  SUBJECTIVE:  Jessica Mcgrath is a 36 y.o. female presenting to establish care. 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  Married for 14 years. She has an IUD in place. Mirena roughly 1.5 years ago.   She been overall "a healthy person," and reports she got really sick "a few years ago" and was advised that she had sepsis and the gene for rheumatoid. This all happened back in 2013 and labs are available in chl.   She is here primarily for hand pain about the bilateral PIPs that has been present for about 1 years.  Complains of "bumps" about the knuckles. She reports having a bowel movement daily or every other day, and she denies chronic abdominal pain, and she denies any history of blood in the stool. She tells me her grandmother had RA.    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 This itched intractably before the cream.   Dep[facility] 2/9 08/09/2016  Decreased Interest 0  Down, Depressed, Hopeless 0  PHQ - 2 Score 0   She is allergic to diclofenac; erythromycin; and hydrocodone.   She  has no past medical history on file.    She  reports that she has never smoked. She has never used smokeless tobacco. She reports that she drinks alcohol. She reports that she does not use drugs. The patient  has no past surgical history on file.  Her family history is not on file.  Review of Systems  Constitutional: Negative for chills and fever.  Respiratory: Negative for shortness of breath and wheezing.   Gastrointestinal: Negative for abdominal pain, blood in stool, constipation, diarrhea and melena.  Musculoskeletal: Positive for joint pain. Negative for back pain, falls, myalgias and neck pain.  Skin: Negative for itching and rash.    Psychiatric/Behavioral: Negative for depression and substance abuse.    The problem list and medications were reviewed and updated by myself where necessary and exist elsewhere in the encounter.   OBJECTIVE:  BP 118/80 (BP Location: Left Arm, Patient Position: Sitting, Cuff Size: Large)   Pulse 77   Temp 98.2 F (36.8 C) (Oral)   Resp 16   Ht 5\' 7"  (1.702 m)   Wt 208 lb (94.3 kg)   SpO2 98%   BMI 32.58 kg/m   Physical Exam  Constitutional: She is oriented to person, place, and time. She appears well-nourished. No distress.  Eyes: EOM are normal. Pupils are equal, round, and reactive to light.  Cardiovascular: Normal rate and regular rhythm.   Pulmonary/Chest: Effort normal and breath sounds normal.  Abdominal: She exhibits no distension.  Musculoskeletal: She exhibits tenderness. She exhibits no edema or deformity.       Hands: Neurological: She is alert and oriented to person, place, and time. No cranial nerve deficit. Gait normal.  Skin: Skin is dry. She is not diaphoretic.  Psychiatric: She has a normal mood and affect.  Vitals reviewed.   No results found for this or any previous visit (from the past 72 hour(s)).  Dg Hand 2 View Right  Result Date: 08/09/2016 CLINICAL DATA:  Proximal interphalangeal joints nodules EXAM: RIGHT HAND - 2 VIEW COMPARISON:  None. FINDINGS: Two views of the right hand submitted.  No acute fracture or subluxation. No periosteal reaction or bony erosion. Joint spaces are preserved. IMPRESSION: Negative. Electronically Signed   By: Natasha Mead M.D.   On: 08/09/2016 09:58   Dg Hand 2 View Left  Result Date: 08/09/2016 CLINICAL DATA:  PIP tender nodules. EXAM: LEFT HAND - 2 VIEW COMPARISON:  No recent prior . FINDINGS: No acute bony or joint abnormality identified. No evidence of fracture or dislocation. IMPRESSION: No acute or focal abnormality. Electronically Signed   By: Maisie Fus  Register   On: 08/09/2016 09:59    ASSESSMENT AND  PLAN:  [name] was seen today for establish care.  Diagnoses and all orders for this visit:  Nodule of finger of both hands Comments: About the PIP, right worse than left. Mildly tender, negative for erythema. Possibly RA.  Will screen.  Orders: -     DG Hand 2 View Left; Future -     DG Hand 2 View Right; Future -     Sedimentation Rate -     C-reactive protein -     ANA w/Reflex if Positive -     Rheumatoid factor  Encounter for medical examination to establish care Comments: RTC in 2 months for annual age appropriate physical.     The patient is advised to call or return to clinic if she does not see an improvement in symptoms, or to seek the care of the closest emergency department if she worsens with the above plan.   Deliah Boston, MHS, PA-C [facility] 08/09/2016 11:27 AM

## 2016-08-09 NOTE — Patient Instructions (Signed)
     IF you received an x-ray today, you will receive an invoice from [facility]. Please contact [facility] at [phone] with questions or concerns regarding your invoice.   IF you received labwork today, you will receive an invoice from [facility]. Please contact [facility] at [phone] with questions or concerns regarding your invoice.   Our billing staff will not be able to assist you with questions regarding bills from these companies.  You will be contacted with the lab results as soon as they are available. The fastest way to get your results is to activate your My Chart account. Instructions are located on the last page of this paperwork. If you have not heard from us regarding the results in 2 weeks, please contact this office.     

## 2016-08-10 ENCOUNTER — Telehealth: Payer: Self-pay | Admitting: Physician Assistant

## 2016-08-10 ENCOUNTER — Telehealth: Payer: Self-pay

## 2016-08-10 DIAGNOSIS — R768 Other specified abnormal immunological findings in serum: Secondary | ICD-10-CM

## 2016-08-10 LAB — ANA W/REFLEX IF POSITIVE
Anti JO-1: <0.2 AI (ref 0.0–0.9)
Anti Nuclear Antibody(ANA): POSITIVE — AB
Centromere Ab Screen: 0.2 AI (ref 0.0–0.9)
ENA RNP Ab: 7.8 AI — ABNORMAL HIGH (ref 0.0–0.9)
Scleroderma SCL-70: <0.2 AI (ref 0.0–0.9)
dsDNA Ab: 9 IU/mL (ref 0–9)

## 2016-08-10 LAB — RHEUMATOID FACTOR: Rhuematoid fact SerPl-aCnc: 17.5 IU/mL — ABNORMAL HIGH (ref 0.0–13.9)

## 2016-08-10 LAB — SEDIMENTATION RATE: SED RATE: 14 mm/h (ref 0–32)

## 2016-08-10 LAB — C-REACTIVE PROTEIN: CRP: 1.2 mg/L (ref 0.0–4.9)

## 2016-08-10 NOTE — Progress Notes (Signed)
I have called and left a message.  She will be going to rheum.  Please send her a hard copy (mail) of these results. Deliah BostonMichael Clark, MS, PA-C 2:20 PM, 08/10/2016

## 2016-08-10 NOTE — Telephone Encounter (Signed)
Recent Results (from the past 2160 hour(s))  Sedimentation Rate     Status: None   Collection Time: 08/09/16  9:42 AM  Result Value Ref Range   Sed Rate 14 0 - 32 mm/hr  C-reactive protein     Status: None   Collection Time: 08/09/16  9:42 AM  Result Value Ref Range   CRP 1.2 0.0 - 4.9 mg/L  ANA w/Reflex if Positive     Status: Abnormal   Collection Time: 08/09/16  9:42 AM  Result Value Ref Range   Anit Nuclear Antibody(ANA) Positive (A) Negative   dsDNA Ab 9 0 - 9 IU/mL    Comment:                                    Negative      <5                                    Equivocal  5 - 9                                    Positive      >9    ENA RNP Ab 7.8 (H) 0.0 - 0.9 AI   ENA SM Ab Ser-aCnc >8.0 (H) 0.0 - 0.9 AI   Scleroderma SCL-70 <0.2 0.0 - 0.9 AI   ENA SSA (RO) Ab >8.0 (H) 0.0 - 0.9 AI   ENA SSB (LA) Ab >8.0 (H) 0.0 - 0.9 AI   Chromatin Ab SerPl-aCnc >8.0 (H) 0.0 - 0.9 AI   Anti JO-1 <0.2 0.0 - 0.9 AI   Centromere Ab Screen 0.2 0.0 - 0.9 AI   See below: Comment     Comment: Autoantibody                       Disease Association ------------------------------------------------------------                         Condition                  Frequency ---------------------   ------------------------   --------- Antinuclear Antibody,    SLE, mixed connective Direct (ANA-D)           tissue diseases ---------------------   ------------------------   --------- dsDNA                    SLE                        40 - 60% ---------------------   ------------------------   --------- Chromatin                Drug induced SLE                90%                          SLE                        48 - 97% ---------------------   ------------------------   --------- SSA (Ro)  SLE                        25 - 35%                          Sjogren's Syndrome         40 - 70%                          Neonatal Lupus                 100% ---------------------    ------------------------   --------- SSB (La)                 SLE                              10%                          Sjogren's Syndrome              30% ---------------------   -----------------------    --------- Sm (anti-Smith)          SLE                        15 - 30% ---------------------   -----------------------    --------- RNP                      Mixed Connective Tissue                          Disease                         95% (U1 nRNP,                SLE                        30 - 50% anti-ribonucleoprotein)  Polymyositis and/or                          Dermatomyositis                 20% ---------------------   ------------------------   --------- Scl-70 (antiDNA          Scleroderma (diffuse)      20 - 35% topoisomerase)           Crest                           13% ---------------------   ------------------------   --------- Jo-1                     Polymyositis and/or                          Dermatomyositis            20 - 40% ---------------------   ------------------------   --------- Centromere B             Scleroderma -  Crest  variant                         80%   Rheumatoid factor     Status: Abnormal   Collection Time: 08/09/16  9:42 AM  Result Value Ref Range   Rhuematoid fact SerPl-aCnc 17.5 (H) 0.0 - 13.9 IU/mL

## 2016-08-10 NOTE — Telephone Encounter (Signed)
Pt is returning Jessica Mcgrath's call regarding her lab work  Please call [phone]

## 2016-08-11 NOTE — Telephone Encounter (Signed)
I've called her back and explained what I can. Referral is in. Deliah BostonMichael Vidur Knust, MS, PA-C 4:55 PM, 08/11/2016

## 2016-08-13 NOTE — Telephone Encounter (Signed)
See referral

## 2016-08-13 NOTE — Telephone Encounter (Signed)
[facility] this [facility].

## 2016-10-04 ENCOUNTER — Ambulatory Visit: Payer: Managed Care, Other (non HMO) | Admitting: Physician Assistant

## 2017-04-21 ENCOUNTER — Ambulatory Visit (INDEPENDENT_AMBULATORY_CARE_PROVIDER_SITE_OTHER): Payer: Managed Care, Other (non HMO) | Admitting: Physician Assistant

## 2017-04-21 ENCOUNTER — Ambulatory Visit (INDEPENDENT_AMBULATORY_CARE_PROVIDER_SITE_OTHER): Payer: Managed Care, Other (non HMO)

## 2017-04-21 ENCOUNTER — Encounter: Payer: Self-pay | Admitting: Physician Assistant

## 2017-04-21 VITALS — BP 126/84 | HR 74 | Temp 98.6°F | Resp 16 | Ht 67.0 in | Wt 220.0 lb

## 2017-04-21 DIAGNOSIS — M790 Rheumatism, unspecified: Secondary | ICD-10-CM

## 2017-04-21 DIAGNOSIS — D72819 Decreased white blood cell count, unspecified: Secondary | ICD-10-CM | POA: Diagnosis not present

## 2017-04-21 DIAGNOSIS — R760 Raised antibody titer: Secondary | ICD-10-CM

## 2017-04-21 DIAGNOSIS — M25561 Pain in right knee: Secondary | ICD-10-CM

## 2017-04-21 DIAGNOSIS — M25562 Pain in left knee: Secondary | ICD-10-CM

## 2017-04-21 LAB — POCT URINALYSIS DIP (MANUAL ENTRY)
Bilirubin, UA: NEGATIVE
Blood, UA: NEGATIVE
Glucose, UA: NEGATIVE mg/dL
Ketones, POC UA: NEGATIVE mg/dL
LEUKOCYTES UA: NEGATIVE
NITRITE UA: NEGATIVE
PROTEIN UA: NEGATIVE mg/dL
SPEC GRAV UA: 1.02 (ref 1.010–1.025)
UROBILINOGEN UA: 1 U/dL
pH, UA: 7.5 (ref 5.0–8.0)

## 2017-04-21 LAB — POCT CBC
Granulocyte percent: 63.3 %G (ref 37–80)
HEMATOCRIT: 39.4 % (ref 37.7–47.9)
HEMOGLOBIN: 13.4 g/dL (ref 12.2–16.2)
LYMPH, POC: 0.7 (ref 0.6–3.4)
MCH, POC: 32.4 pg — AB (ref 27–31.2)
MCHC: 34 g/dL (ref 31.8–35.4)
MCV: 95.3 fL (ref 80–97)
MID (cbc): 0.4 (ref 0–0.9)
MPV: 6.8 fL (ref 0–99.8)
POC GRANULOCYTE: 1.8 — AB (ref 2–6.9)
POC LYMPH %: 22.5 % (ref 10–50)
POC MID %: 14.2 % — AB (ref 0–12)
Platelet Count, POC: 199 10*3/uL (ref 142–424)
RBC: 4.14 M/uL (ref 4.04–5.48)
RDW, POC: 11.9 %
WBC: 2.9 10*3/uL — AB (ref 4.6–10.2)

## 2017-04-21 LAB — POCT GLYCOSYLATED HEMOGLOBIN (HGB A1C): HEMOGLOBIN A1C: 5.7

## 2017-04-21 MED ORDER — PREDNISONE 20 MG PO TABS: ORAL_TABLET | ORAL | 0 refills | Status: AC

## 2017-04-21 NOTE — Progress Notes (Signed)
04/25/2017 1:49 PM   DOB: [birth year] / MRN: [id number]  SUBJECTIVE:  Jessica Mcgrath is a 36 y.o. female presenting for pain "all over." However this is mostly in the elbows, knees, hands.She will have these pains most nights and symptoms are better during the day. Ibuprofen does not help. Tells me the pain is worse at night.  No night sweats. Had a positive rheum panel in the past and saw Rheum but no diagnosis was made. Denies new abdominal pain and dysuria.  Denies chest pain, SOB, cough.  Will have sore throat with joint pain and myalgia.   She is allergic to diclofenac; erythromycin; and hydrocodone.   She  has no past medical history on file.    She  reports that she has never smoked. She has never used smokeless tobacco. She reports that she drinks alcohol. She reports that she does not use drugs. She  has no sexual activity history on file. The patient  has no past surgical history on file.  Her family history is not on file.  Review of Systems  Constitutional: Negative for chills, diaphoresis and fever.  Respiratory: Negative for cough, hemoptysis, sputum production, shortness of breath and wheezing.   Cardiovascular: Negative for chest pain, orthopnea and leg swelling.  Gastrointestinal: Negative for nausea.  Musculoskeletal: Positive for joint pain and myalgias.  Skin: Negative for rash.  Neurological: Negative for dizziness.    The problem list and medications were reviewed and updated by myself where necessary and exist elsewhere in the encounter.   OBJECTIVE:  BP 126/84 (BP Location: Left Arm, Patient Position: Sitting, Cuff Size: Large)   Pulse 74   Temp 98.6 F (37 C) (Oral)   Resp 16   Ht  (1.702 m)   Wt 220 lb (99.8 kg)   SpO2 100%   BMI 34.46 kg/m   Physical Exam  Constitutional: She is oriented to person, place, and time. She is active.  Non-toxic appearance.  Eyes: Pupils are equal, round, and reactive to light. EOM are normal.    Cardiovascular: Normal rate, regular rhythm, S1 normal, S2 normal, normal heart sounds and intact distal pulses.  Exam reveals no gallop, no friction rub and no decreased pulses.   No murmur heard. Pulmonary/Chest: Effort normal. No stridor. No tachypnea. No respiratory distress. She has no wheezes. She has no rales.  Abdominal: She exhibits no distension.  Musculoskeletal: She exhibits no edema.  Neurological: She is alert and oriented to person, place, and time. She has normal strength and normal reflexes. She is not disoriented. She displays no atrophy. No cranial nerve deficit or sensory deficit. She exhibits normal muscle tone. Coordination and gait normal.  Skin: Skin is warm and dry. She is not diaphoretic. No pallor.  Psychiatric: Her behavior is normal.   Recent Results (from the past 2160 hour(s))  POCT CBC     Status: Abnormal   Collection Time: 04/21/17  5:34 PM  Result Value Ref Range   WBC 2.9 (A) 4.6 - 10.2 K/uL   Lymph, poc 0.7 0.6 - 3.4   POC LYMPH PERCENT 22.5 10 - 50 %L   MID (cbc) 0.4 0 - 0.9   POC MID % 14.2 (A) 0 - 12 %M   POC Granulocyte 1.8 (A) 2 - 6.9   Granulocyte percent 63.3 37 - 80 %G   RBC 4.14 4.04 - 5.48 M/uL   Hemoglobin 13.4 12.2 - 16.2 g/dL   HCT, POC 40.9 81.1 - 47.9 %  MCV 95.3 80 - 97 fL   MCH, POC 32.4 (A) 27 - 31.2 pg   MCHC 34.0 31.8 - 35.4 g/dL   RDW, POC 16.1 %   Platelet Count, POC 199 142 - 424 K/uL   MPV 6.8 0 - 99.8 fL  POCT glycosylated hemoglobin (Hb A1C)     Status: None   Collection Time: 04/21/17  5:45 PM  Result Value Ref Range   Hemoglobin A1C 5.7   TSH     Status: None   Collection Time: 04/21/17  5:49 PM  Result Value Ref Range   TSH 1.380 0.450 - 4.500 uIU/mL  Pathologist smear review     Status: Abnormal (Preliminary result)   Collection Time: 04/21/17  5:49 PM  Result Value Ref Range   Path Rev WBC Comment     Comment: Specimen has been received and testing has been initiated.   Path Rev RBC WILL FOLLOW    Path  Rev PLTs WILL FOLLOW    PATH INTERP BLD-IMP WILL FOLLOW    PATHOLOGIST NAME WILL FOLLOW    WBC 3.1 (L) 3.4 - 10.8 x10E3/uL   RBC 4.11 3.77 - 5.28 x10E6/uL   Hemoglobin 13.6 11.1 - 15.9 g/dL   Hematocrit 09.6 04.5 - 46.6 %   MCV 93 79 - 97 fL   MCH 33.1 (H) 26.6 - 33.0 pg   MCHC 35.5 31.5 - 35.7 g/dL   RDW 40.9 (L) 81.1 - 91.4 %   Platelets 225 150 - 379 x10E3/uL   Neutrophils 53 Not Estab. %   Lymphs 24 Not Estab. %   Monocytes 17 Not Estab. %   Eos 6 Not Estab. %   Basos 0 Not Estab. %   Neutrophils Absolute 1.6 1.4 - 7.0 x10E3/uL   Lymphocytes Absolute 0.7 0.7 - 3.1 x10E3/uL   Monocytes Absolute 0.5 0.1 - 0.9 x10E3/uL   EOS (ABSOLUTE) 0.2 0.0 - 0.4 x10E3/uL   Basophils Absolute 0.0 0.0 - 0.2 x10E3/uL   Immature Granulocytes 0 Not Estab. %   Immature Grans (Abs) 0.0 0.0 - 0.1 x10E3/uL  POCT urinalysis dipstick     Status: None   Collection Time: 04/21/17  6:10 PM  Result Value Ref Range   Color, UA yellow yellow   Clarity, UA clear clear   Glucose, UA negative negative mg/dL   Bilirubin, UA negative negative   Ketones, POC UA negative negative mg/dL   Spec Grav, UA 7.829 5.621 - 1.025   Blood, UA negative negative   pH, UA 7.5 5.0 - 8.0   Protein Ur, POC negative negative mg/dL   Urobilinogen, UA 1.0 0.2 or 1.0 E.U./dL   Nitrite, UA Negative Negative   Leukocytes, UA Negative Negative      No results found for this or any previous visit (from the past 72 hour(s)).  No results found.  ASSESSMENT AND PLAN:  Jessica Mcgrath was seen today for pain.  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  WIll try her on pred as a trial.  She will come back in a week.  -     predniSONE (DELTASONE) 20 MG tablet; Take 3 in the morning for 3 days, then 2 in the morning for 3 days, and then 1 in the morning for 3 days.  Rheumatism -     POCT CBC -     POCT glycosylated hemoglobin (Hb A1C) -  POCT  urinalysis dipstick -     DG Knee Complete 4 Views Left; Future -     DG Knee Complete 4 Views Right; Future  Pain in both knees, unspecified chronicity  Leukopenia, unspecified type -     TSH -     Pathologist smear review -     Pathologist smear review    The patient is advised to call or return to clinic if she does not see an improvement in symptoms, or to seek the care of the closest emergency department if she worsens with the above plan.   Deliah Boston, MHS, PA-C Primary Care at [facility] 04/25/2017 1:49 PM

## 2017-04-21 NOTE — Patient Instructions (Signed)
     IF you received an x-ray today, you will receive an invoice from [facility]. Please contact [facility] at [phone] with questions or concerns regarding your invoice.   IF you received labwork today, you will receive an invoice from [facility]. Please contact [facility] at [phone] with questions or concerns regarding your invoice.   Our billing staff will not be able to assist you with questions regarding bills from these companies.  You will be contacted with the lab results as soon as they are available. The fastest way to get your results is to activate your My Chart account. Instructions are located on the last page of this paperwork. If you have not heard from us regarding the results in 2 weeks, please contact this office.     

## 2017-04-25 ENCOUNTER — Encounter (HOSPITAL_COMMUNITY): Payer: Self-pay | Admitting: Emergency Medicine

## 2017-04-27 LAB — PATHOLOGIST SMEAR REVIEW
BASOS ABS: 0 10*3/uL (ref 0.0–0.2)
Basos: 0 %
EOS (ABSOLUTE): 0.2 10*3/uL (ref 0.0–0.4)
Eos: 6 %
Hematocrit: 38.3 % (ref 34.0–46.6)
Hemoglobin: 13.6 g/dL (ref 11.1–15.9)
IMMATURE GRANS (ABS): 0 10*3/uL (ref 0.0–0.1)
IMMATURE GRANULOCYTES: 0 %
LYMPHS: 24 %
Lymphocytes Absolute: 0.7 10*3/uL (ref 0.7–3.1)
MCH: 33.1 pg — ABNORMAL HIGH (ref 26.6–33.0)
MCHC: 35.5 g/dL (ref 31.5–35.7)
MCV: 93 fL (ref 79–97)
MONOCYTES: 17 %
MONOS ABS: 0.5 10*3/uL (ref 0.1–0.9)
NEUTROS PCT: 53 %
Neutrophils Absolute: 1.6 10*3/uL (ref 1.4–7.0)
PATH REV PLTS: NORMAL
PATH REV WBC: NORMAL
PLATELETS: 225 10*3/uL (ref 150–379)
Path Rev RBC: NORMAL
RBC: 4.11 x10E6/uL (ref 3.77–5.28)
RDW: 11.9 % — AB (ref 12.3–15.4)
WBC: 3.1 10*3/uL — ABNORMAL LOW (ref 3.4–10.8)

## 2017-04-27 LAB — TSH: TSH: 1.38 u[IU]/mL (ref 0.450–4.500)

## 2017-05-02 ENCOUNTER — Encounter: Payer: Self-pay | Admitting: Physician Assistant

## 2017-05-02 ENCOUNTER — Ambulatory Visit (INDEPENDENT_AMBULATORY_CARE_PROVIDER_SITE_OTHER): Payer: Managed Care, Other (non HMO) | Admitting: Physician Assistant

## 2017-05-02 VITALS — BP 116/84 | HR 86 | Temp 98.4°F | Resp 16 | Ht 65.0 in | Wt 222.4 lb

## 2017-05-02 DIAGNOSIS — M790 Rheumatism, unspecified: Secondary | ICD-10-CM

## 2017-05-02 NOTE — Patient Instructions (Signed)
     IF you received an x-ray today, you will receive an invoice from [facility]. Please contact [facility] at [phone] with questions or concerns regarding your invoice.   IF you received labwork today, you will receive an invoice from [facility]. Please contact [facility] at [phone] with questions or concerns regarding your invoice.   Our billing staff will not be able to assist you with questions regarding bills from these companies.  You will be contacted with the lab results as soon as they are available. The fastest way to get your results is to activate your My Chart account. Instructions are located on the last page of this paperwork. If you have not heard from us regarding the results in 2 weeks, please contact this office.     

## 2017-05-02 NOTE — Progress Notes (Signed)
    05/05/2017 12:09 PM   DOB: 01/25/1981 / MRN: [id number]  SUBJECTIVE:  Jessica Mcgrath is a 36 y.o. female presenting for recheck of joint pain.  Per my last note I had started her on PO pred. She tells me that her hand pain, which she has everyday, went away within 24 hours of starting PO pred.  She also tells me that her grandmother's hands were deformed.   She is allergic to diclofenac; erythromycin; and hydrocodone.   She  has no past medical history on file.    She  reports that she has never smoked. She has never used smokeless tobacco. She reports that she drinks alcohol. She reports that she does not use drugs. She  has no sexual activity history on file. The patient  has no past surgical history on file.  Her family history is not on file.  Review of Systems  Constitutional: Negative for chills and fever.  Gastrointestinal: Negative for nausea.  Skin: Negative for itching and rash.  Neurological: Negative for dizziness.    The problem list and medications were reviewed and updated by myself where necessary and exist elsewhere in the encounter.   OBJECTIVE:  BP 116/84 (BP Location: Left Arm, Patient Position: Sitting, Cuff Size: Large)   Pulse 86   Temp 98.4 F (36.9 C) (Oral)   Resp 16   Ht  (1.651 m)   Wt 222 lb 6.4 oz (100.9 kg)   SpO2 99%   BMI 37.01 kg/m   Physical Exam  Constitutional: She is active.  Non-toxic appearance.  Cardiovascular: Normal rate, regular rhythm, S1 normal, S2 normal, normal heart sounds and intact distal pulses.  Exam reveals no gallop, no friction rub and no decreased pulses.   No murmur heard. Pulmonary/Chest: Effort normal. No stridor. No tachypnea. No respiratory distress. She has no wheezes. She has no rales.  Abdominal: She exhibits no distension.  Musculoskeletal: She exhibits no edema.  Neurological: She is alert.  Skin: Skin is warm and dry. She is not diaphoretic. No pallor.    No results found for this or any  previous visit (from the past 72 hour(s)).  No results found.  ASSESSMENT AND PLAN:  Envy was seen today for follow-up.  Diagnoses and all orders for this visit:  Rheumatism Comments: Complete resolution of symptoms with prednisone.  No radiographic explanation of her arthritis otherwise.  She may need to see rheum at [facility]. She will call to get back into her rheumatologist.     The patient is advised to call or return to clinic if she does not see an improvement in symptoms, or to seek the care of the closest emergency department if she worsens with the above plan.   Deliah Boston, MHS, PA-C Primary Care at [facility] 05/05/2017 12:09 PM

## 2017-05-05 DIAGNOSIS — M790 Rheumatism, unspecified: Secondary | ICD-10-CM | POA: Insufficient documentation

## 2017-09-05 ENCOUNTER — Ambulatory Visit: Payer: Managed Care, Other (non HMO) | Admitting: Physician Assistant

## 2017-09-07 ENCOUNTER — Ambulatory Visit: Payer: Managed Care, Other (non HMO) | Admitting: Physician Assistant

## 2017-09-07 ENCOUNTER — Encounter: Payer: Self-pay | Admitting: Physician Assistant

## 2017-09-07 VITALS — BP 112/75 | HR 66 | Resp 16 | Ht 65.0 in | Wt 228.0 lb

## 2017-09-07 DIAGNOSIS — D8989 Other specified disorders involving the immune mechanism, not elsewhere classified: Secondary | ICD-10-CM

## 2017-09-07 DIAGNOSIS — E669 Obesity, unspecified: Secondary | ICD-10-CM

## 2017-09-07 NOTE — Patient Instructions (Signed)
     IF you received an x-ray today, you will receive an invoice from [facility]. Please contact [facility] at [phone] with questions or concerns regarding your invoice.   IF you received labwork today, you will receive an invoice from [facility]. Please contact [facility] at [phone] with questions or concerns regarding your invoice.   Our billing staff will not be able to assist you with questions regarding bills from these companies.  You will be contacted with the lab results as soon as they are available. The fastest way to get your results is to activate your My Chart account. Instructions are located on the last page of this paperwork. If you have not heard from us regarding the results in 2 weeks, please contact this office.     

## 2017-09-07 NOTE — Progress Notes (Signed)
09/07/2017 11:08 AM   DOB: 1981/03/08 / MRN: [id number]  SUBJECTIVE:  Jessica Mcgrath is a 37 y.o. female presenting for recheck of joint pain.  Last time I saw her she was having a flare of several joints in her body.  She was placed on prednisone and advised to go back to rheumatology she has had some abnormal labs in the past.  She was started on Plaquenil despite having a firm diagnosis and tells me that since taking the medication she has no longer had any joint pain.  She was never given a specific diagnosis.  Tells me she has been having difficulty with weight for some time now and her weight continues to go up.  She worked hard to eat properly however admits that she does not quite have a great scope of knowledge with regard to diet.  She would like to see a registered dietitian.  She is allergic to diclofenac; erythromycin; and hydrocodone.   She  has no past medical history on file.    She  reports that  has never smoked. she has never used smokeless tobacco. She reports that she drinks alcohol. She reports that she does not use drugs. She  has no sexual activity history on file. The patient  has no past surgical history on file.  Her family history is not on file.  Review of Systems  Constitutional: Negative for chills, diaphoresis and fever.  Eyes: Negative.   Respiratory: Negative for cough, hemoptysis, sputum production, shortness of breath and wheezing.   Cardiovascular: Negative for chest pain, orthopnea and leg swelling.  Gastrointestinal: Negative for nausea.  Skin: Negative for rash.  Neurological: Negative for dizziness, sensory change, speech change, focal weakness and headaches.    The problem list and medications were reviewed and updated by myself where necessary and exist elsewhere in the encounter.   OBJECTIVE:  BP 112/75 (BP Location: Right Arm, Patient Position: Sitting, Cuff Size: Large)   Pulse 66   Resp 16   Ht 5\' 5"  (1.651 m)   Wt 228 lb  (103.4 kg)   SpO2 99%   BMI 37.94 kg/m   Physical Exam  Constitutional: She is active.  Non-toxic appearance.  Cardiovascular: Normal rate, regular rhythm, S1 normal, S2 normal, normal heart sounds and intact distal pulses. Exam reveals no gallop, no friction rub and no decreased pulses.  No murmur heard. Pulmonary/Chest: Effort normal. No tachypnea. She has no rales.  Abdominal: She exhibits no distension.  Musculoskeletal: She exhibits no edema.  Neurological: She is alert.  Skin: Skin is warm and dry. She is not diaphoretic. No pallor.    No results found for this or any previous visit (from the past 72 hour(s)).  No results found.  ASSESSMENT AND PLAN:  Jessica Mcgrath was seen today for joint pain.  Diagnoses and all orders for this visit:  Autoimmune disorder [facility](HCC) patient without a specific diagnosis however this is most likely lupus.  She is responding well to Plaquenil:.  She is seen by rheumatology and they will continue to see her for management. -     CBC -     CMP and Liver  Obesity (BMI 35.0-39.9 without comorbidity): b checking a TSH and A1c.  We will get her in with medical nutrition. -     Hemoglobin A1c -     Amb ref to Medical Nutrition Therapy-MNT -     TSH    The patient is advised to call or return to clinic  if she does not see an improvement in symptoms, or to seek the care of the closest emergency department if she worsens with the above plan.   Deliah Boston, MHS, PA-C Primary Care at [facility] 09/07/2017 11:08 AM

## 2017-09-08 LAB — CBC
Hematocrit: 37.8 % (ref 34.0–46.6)
Hemoglobin: 13 g/dL (ref 11.1–15.9)
MCH: 32.3 pg (ref 26.6–33.0)
MCHC: 34.4 g/dL (ref 31.5–35.7)
MCV: 94 fL (ref 79–97)
PLATELETS: 219 10*3/uL (ref 150–379)
RBC: 4.02 x10E6/uL (ref 3.77–5.28)
RDW: 12.2 % — AB (ref 12.3–15.4)
WBC: 3 10*3/uL — AB (ref 3.4–10.8)

## 2017-09-08 LAB — CMP AND LIVER
ALT: 25 IU/L (ref 0–32)
AST: 24 IU/L (ref 0–40)
Albumin: 4 g/dL (ref 3.5–5.5)
Alkaline Phosphatase: 75 IU/L (ref 39–117)
BUN: 12 mg/dL (ref 6–20)
Bilirubin Total: 0.3 mg/dL (ref 0.0–1.2)
Bilirubin, Direct: 0.13 mg/dL (ref 0.00–0.40)
CALCIUM: 8.8 mg/dL (ref 8.7–10.2)
CO2: 22 mmol/L (ref 20–29)
CREATININE: 0.71 mg/dL (ref 0.57–1.00)
Chloride: 106 mmol/L (ref 96–106)
GFR, EST AFRICAN AMERICAN: 129 mL/min/{1.73_m2} (ref >59)
GFR, EST NON AFRICAN AMERICAN: 111 mL/min/{1.73_m2} (ref >59)
GLUCOSE: 90 mg/dL (ref 65–99)
Potassium: 4.3 mmol/L (ref 3.5–5.2)
Sodium: 139 mmol/L (ref 134–144)
Total Protein: 7.2 g/dL (ref 6.0–8.5)

## 2017-09-08 LAB — HEMOGLOBIN A1C
Est. average glucose Bld gHb Est-mCnc: 111 mg/dL
Hgb A1c MFr Bld: 5.5 % (ref 4.8–5.6)

## 2017-09-08 LAB — TSH: TSH: 1.39 u[IU]/mL (ref 0.450–4.500)

## 2017-09-09 LAB — WHITE BLOOD COUNT AND DIFFERENTIAL
BASOS: 0 %
Basophils Absolute: 0 10*3/uL (ref 0.0–0.2)
EOS (ABSOLUTE): 0.2 10*3/uL (ref 0.0–0.4)
EOS: 6 %
Immature Grans (Abs): 0 10*3/uL (ref 0.0–0.1)
Immature Granulocytes: 0 %
LYMPHS ABS: 0.8 10*3/uL (ref 0.7–3.1)
LYMPHS: 27 %
MONOCYTES: 14 %
MONOS ABS: 0.4 10*3/uL (ref 0.1–0.9)
NEUTROS ABS: 1.6 10*3/uL (ref 1.4–7.0)
Neutrophils: 53 %
WBC: 3 10*3/uL — AB (ref 3.4–10.8)

## 2017-09-09 LAB — SPECIMEN STATUS REPORT

## 2017-09-12 ENCOUNTER — Encounter: Payer: Self-pay | Admitting: Physician Assistant

## 2017-11-17 ENCOUNTER — Encounter (INDEPENDENT_AMBULATORY_CARE_PROVIDER_SITE_OTHER): Payer: Managed Care, Other (non HMO)

## 2017-11-29 ENCOUNTER — Ambulatory Visit (INDEPENDENT_AMBULATORY_CARE_PROVIDER_SITE_OTHER): Payer: Managed Care, Other (non HMO) | Admitting: Family Medicine

## 2017-11-29 ENCOUNTER — Encounter (INDEPENDENT_AMBULATORY_CARE_PROVIDER_SITE_OTHER): Payer: Self-pay

## 2019-01-06 IMAGING — DX DG KNEE COMPLETE 4+V*R*
4 series · 4 of 4 positions shown · non-contrast
Comparison: None

CLINICAL DATA: Sporadic generalized BILATERAL knee pain

EXAM:
RIGHT KNEE - COMPLETE 4+ VIEW

[knee ap]
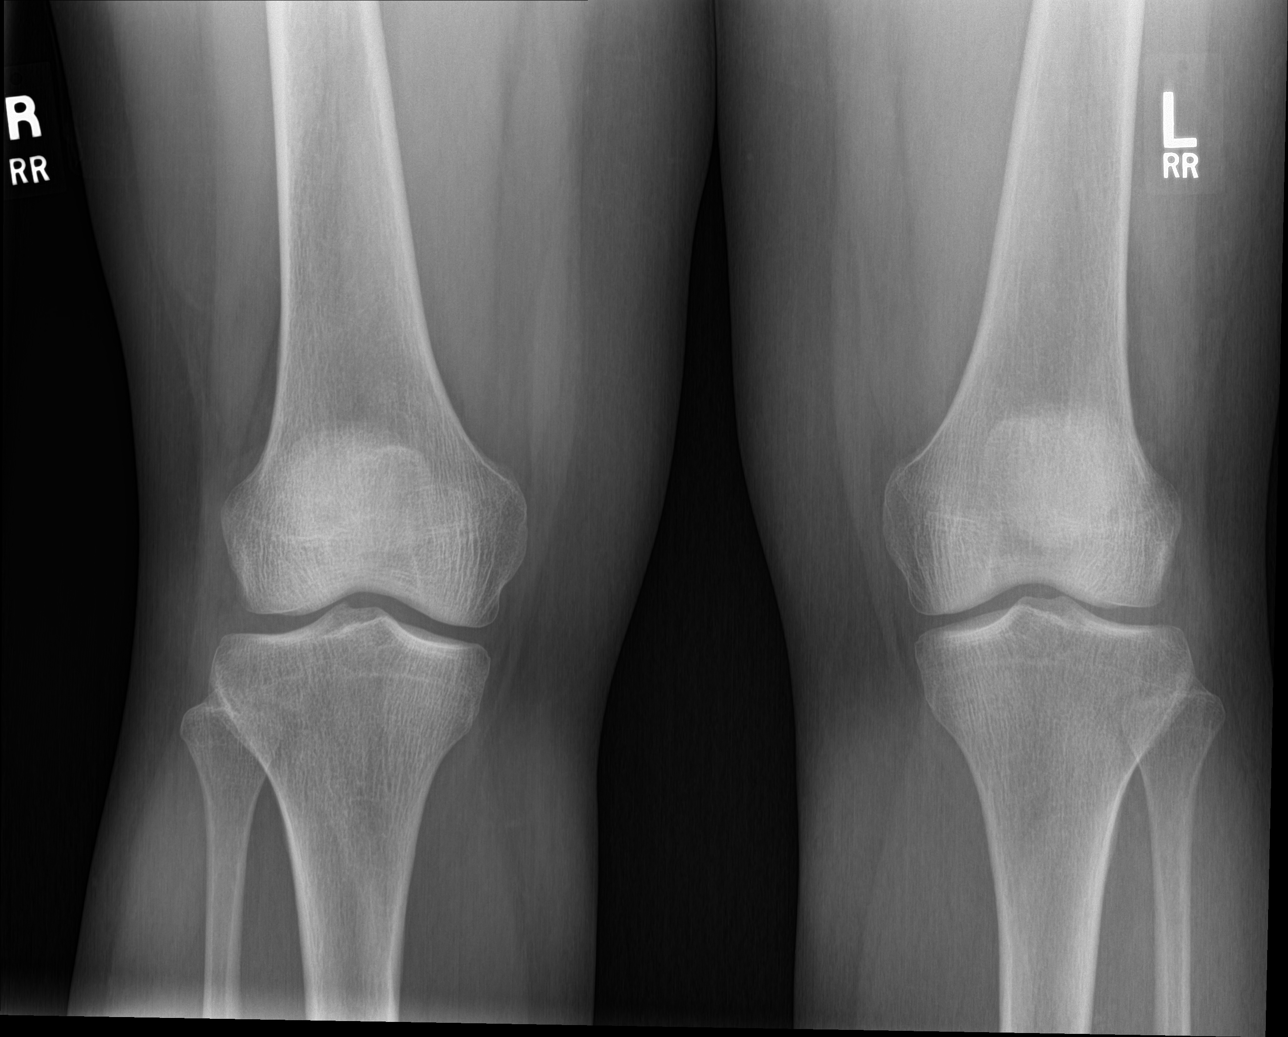

[knee obl (1 of 2)]
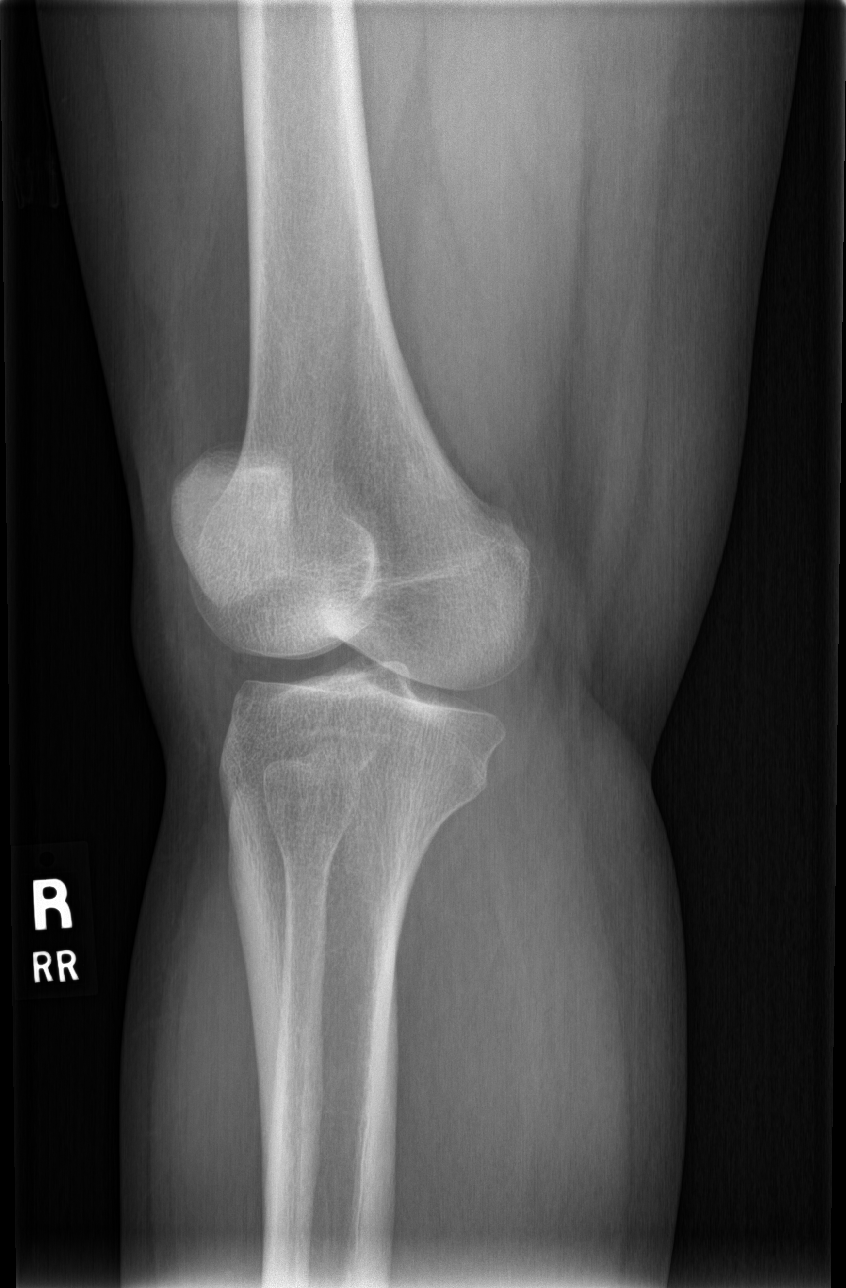

[knee obl (2 of 2)]
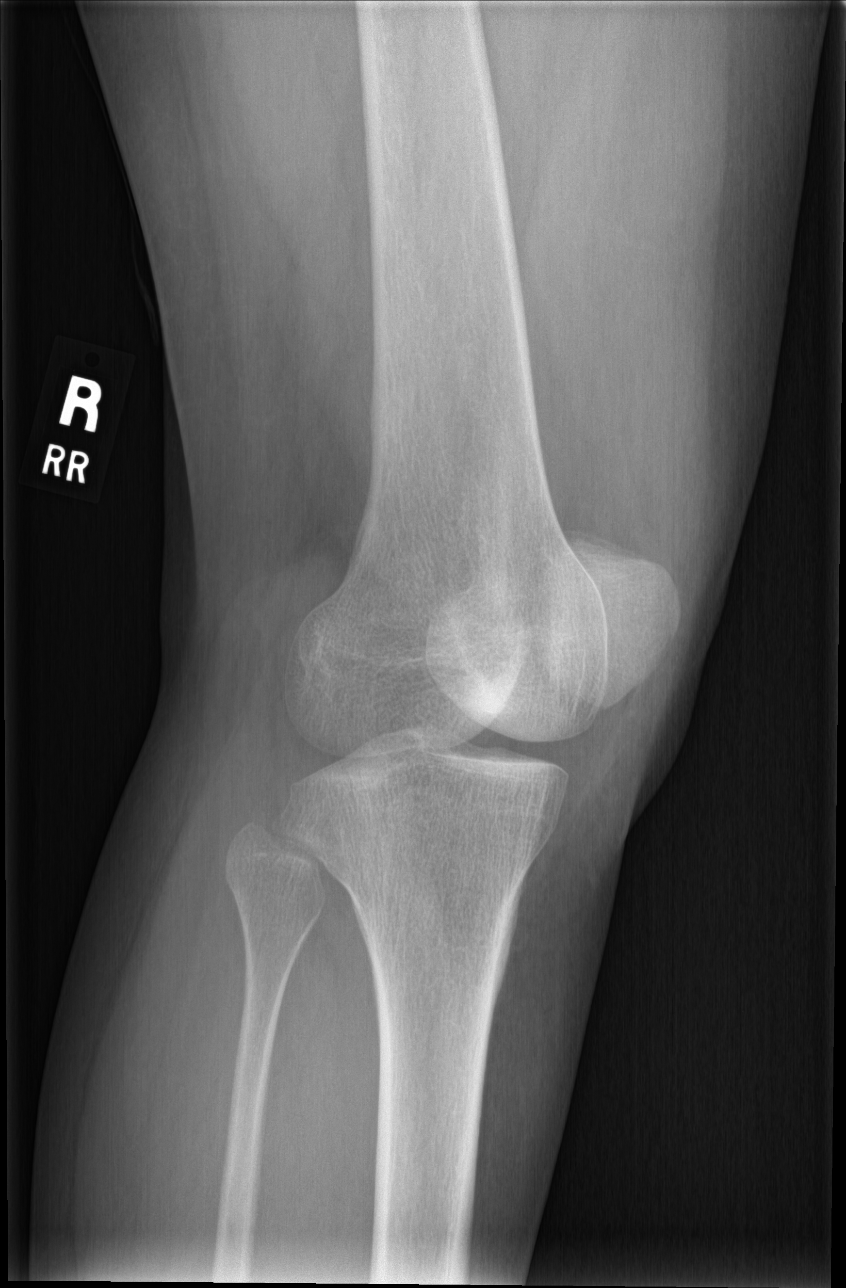

[knee lat]
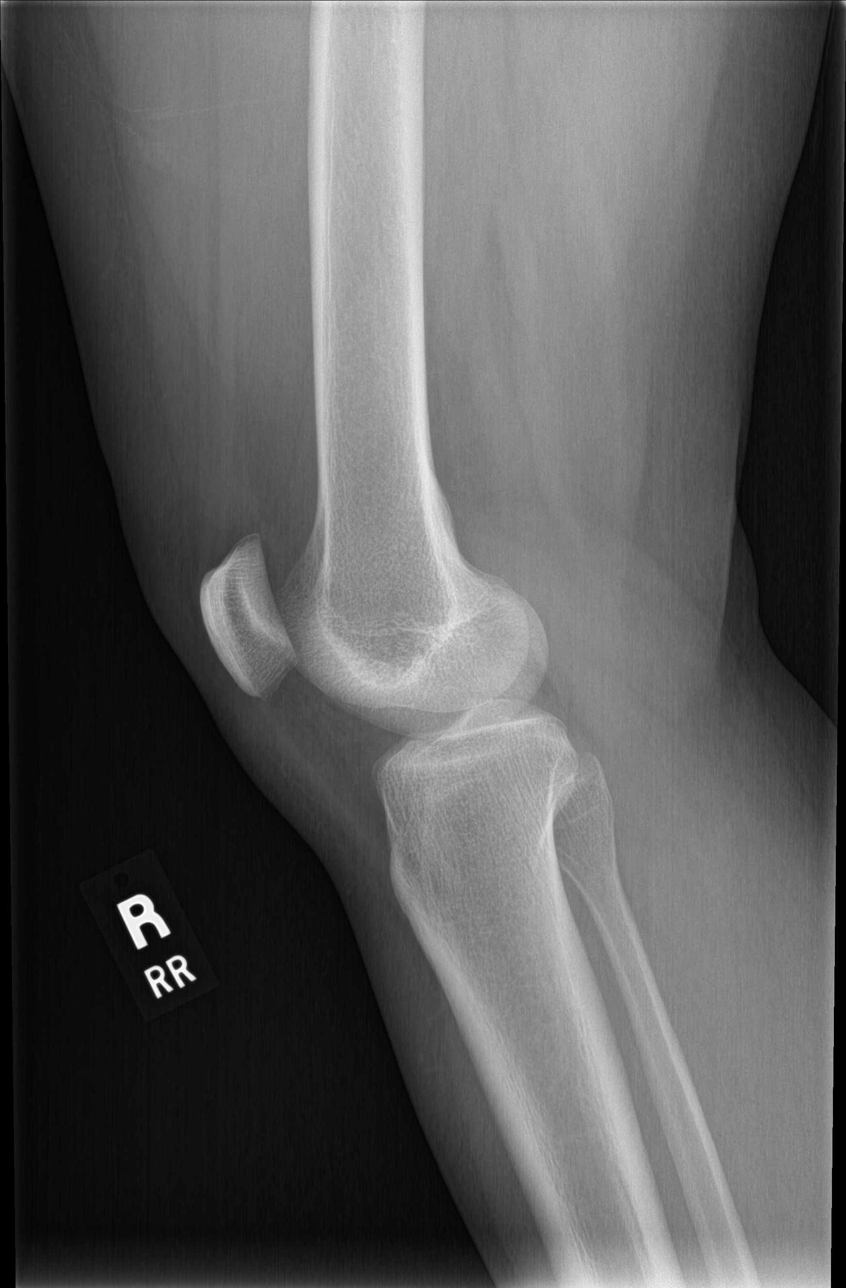

[4 of 4 positions shown; findings below may reference images not displayed]

FINDINGS: Osseous mineralization grossly normal for technique.

Joint spaces preserved.

No acute fracture, dislocation, or bone destruction.

No knee joint effusion.
IMPRESSION: No acute abnormalities.

## 2019-01-06 IMAGING — DX DG KNEE COMPLETE 4+V*L*
3 series · 3 of 3 positions shown · non-contrast
Comparison: None

CLINICAL DATA: Sporadic generalized BILATERAL knee pain

EXAM:
LEFT KNEE - COMPLETE 4+ VIEW

[knee obl (1 of 2)]
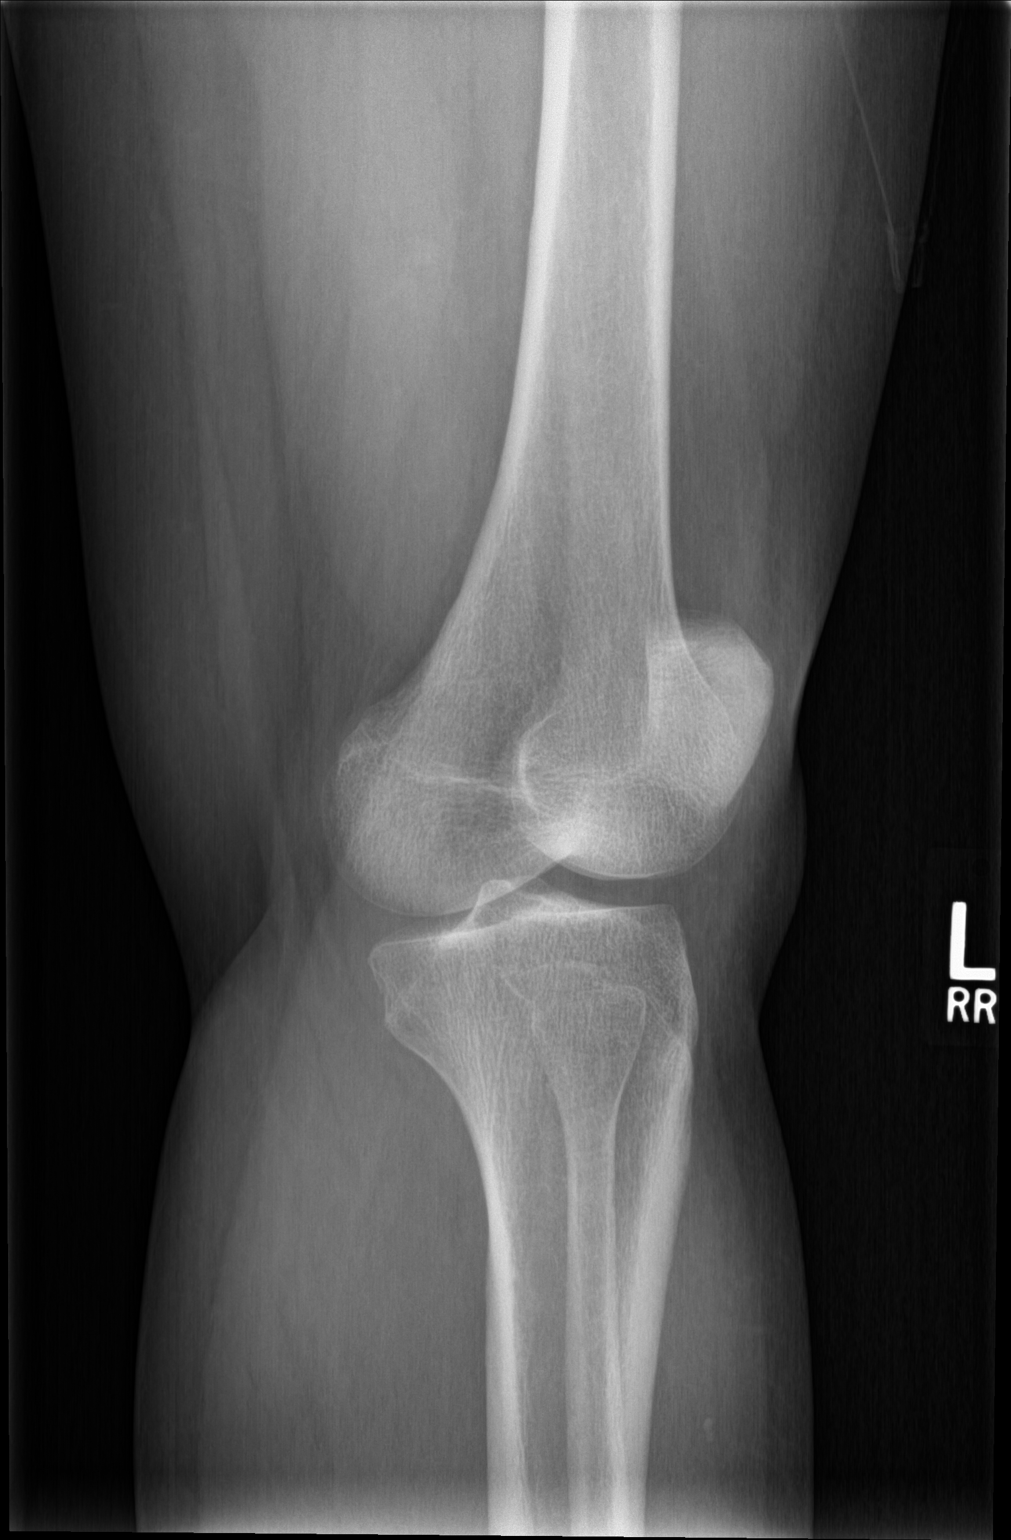

[knee obl (2 of 2)]
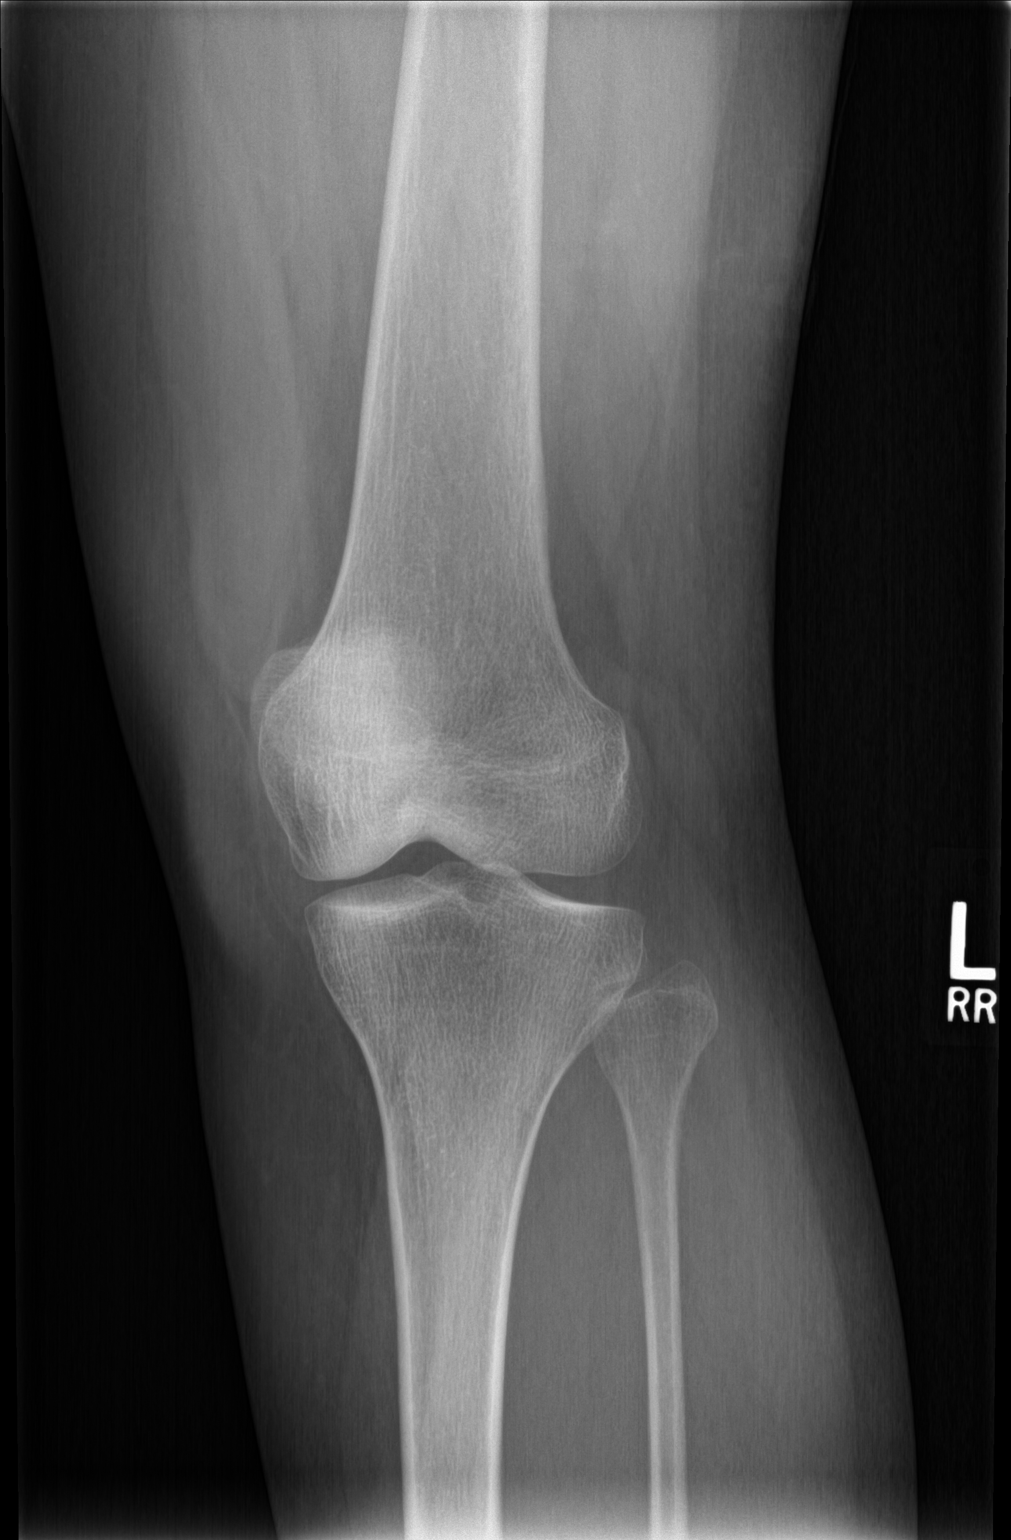

[knee lat]
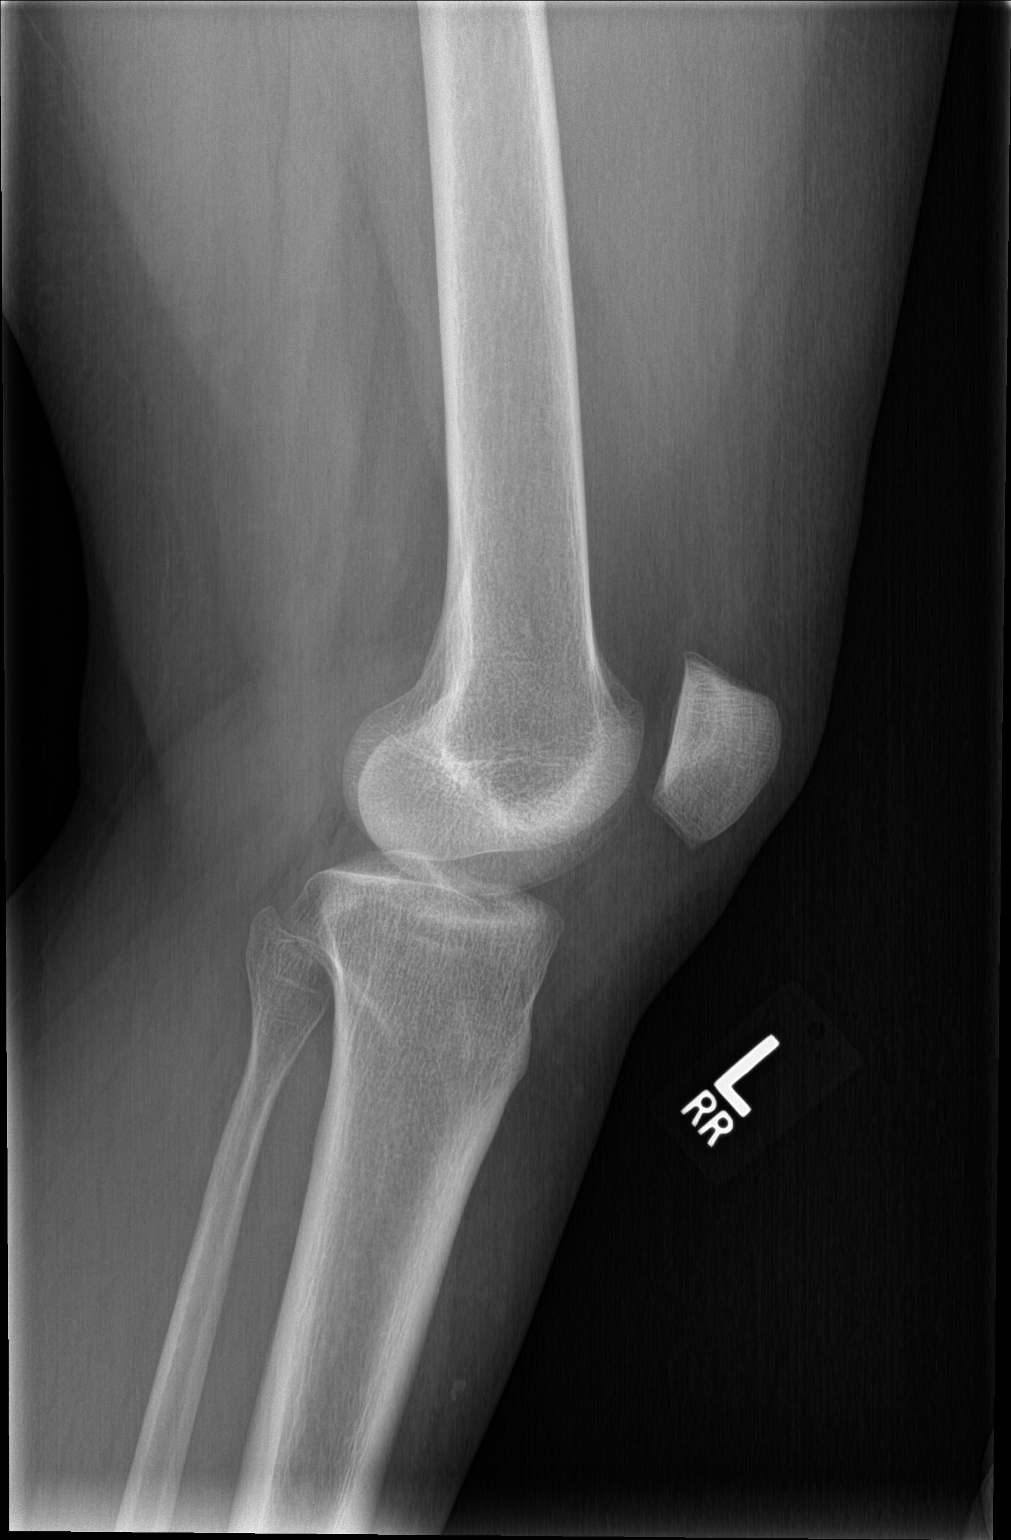

[3 of 3 positions shown; findings below may reference images not displayed]

FINDINGS: Osseous mineralization grossly normal for technique.

Joint spaces preserved.

No acute fracture, dislocation, or bone destruction.

No knee joint effusion.
IMPRESSION: No acute abnormalities.

## 2019-08-16 ENCOUNTER — Other Ambulatory Visit: Payer: Self-pay

## 2019-08-16 ENCOUNTER — Emergency Department (HOSPITAL_BASED_OUTPATIENT_CLINIC_OR_DEPARTMENT_OTHER)
Admission: EM | Admit: 2019-08-16 | Discharge: 2019-08-16 | Disposition: A | Discharge status: Home / Self Care | Payer: Managed Care, Other (non HMO) | Attending: Emergency Medicine | Admitting: Emergency Medicine

## 2019-08-16 ENCOUNTER — Encounter (HOSPITAL_BASED_OUTPATIENT_CLINIC_OR_DEPARTMENT_OTHER): Payer: Self-pay

## 2019-08-16 DIAGNOSIS — R000 Tachycardia, unspecified: Secondary | ICD-10-CM | POA: Diagnosis present

## 2019-08-16 DIAGNOSIS — R002 Palpitations: Secondary | ICD-10-CM | POA: Diagnosis not present

## 2019-08-16 NOTE — ED Triage Notes (Signed)
Pt states her Apple watch alarmed her that her HR was up to 120 while she was at rest. Pt wanted to get checked out. Denies symptoms or stress.

## 2019-08-22 NOTE — ED Provider Notes (Signed)
[facility] Provider Note   CSN: [id number] Arrival date & time: 08/16/19  1902     History Chief Complaint  Patient presents with  . Elevated HR    Goshen is a 39 y.o. female.  HPI    39 year old female with palpitations/tachycardia.  She notes that her heart rate was elevated when she got out of the shower.  Her watch reported a heart rate in the 120s.  She has noticed that her resting heart rate has been around 95.  She really has no other complaints.  No dizziness or lightheadedness.  No dyspnea.  No fevers or chills.  No lightheadedness or dizziness.  History reviewed. No pertinent past medical history.  Patient Active Problem List   Diagnosis Date Noted  . Rheumatism 05/05/2017  . Leukopenia 05/22/2012    History reviewed. No pertinent surgical history.   OB History   No obstetric history on file.     No family history on file.  Social History   Tobacco Use  . Smoking status: Never Smoker  . Smokeless tobacco: Never Used  Substance Use Topics  . Alcohol use: Yes  . Drug use: No    Home Medications Prior to Admission medications   Medication Sig Start Date End Date Taking? Authorizing Provider  hydroxychloroquine (PLAQUENIL) 200 MG tablet TK 1 T PO BID WF OR MILK 06/20/17   [provider]  levonorgestrel (MIRENA) 20 MCG/24HR IUD by Intrauterine route.    [provider]    Allergies    Diclofenac, Erythromycin, and Hydrocodone  Review of Systems   Review of Systems All systems reviewed and negative, other than as noted in HPI.   Physical Exam Updated Vital Signs BP 140/79 (BP Location: Left Arm)   Pulse 72   Temp 98.6 F (37 C) (Oral)   Resp 20   Ht 5\' 6"  (1.676 m)   Wt 108 kg   SpO2 99%   BMI 38.41 kg/m   Physical Exam Vitals and nursing note reviewed.  Constitutional:      General: She is not in acute distress.    Appearance: She is well-developed.  HENT:   Head: Normocephalic and atraumatic.  Eyes:     General:        Right eye: No discharge.        Left eye: No discharge.     Conjunctiva/sclera: Conjunctivae normal.  Cardiovascular:     Rate and Rhythm: Normal rate and regular rhythm.     Heart sounds: Normal heart sounds. No murmur. No friction rub. No gallop.   Pulmonary:     Effort: Pulmonary effort is normal. No respiratory distress.     Breath sounds: Normal breath sounds.  Abdominal:     General: There is no distension.     Palpations: Abdomen is soft.     Tenderness: There is no abdominal tenderness.  Musculoskeletal:        General: No tenderness.     Cervical back: Neck supple.     Comments: Lower extremities symmetric as compared to each other. No calf tenderness. Negative Homan's. No palpable cords.   Skin:    General: Skin is warm and dry.  Neurological:     Mental Status: She is alert.  Psychiatric:        Behavior: Behavior normal.        Thought Content: Thought content normal.     ED Results / Procedures / Treatments   Labs (all  labs ordered are listed, but only abnormal results are displayed) Labs Reviewed - No data to display  EKG EKG Interpretation  Date/Time:  Thursday August 16 2019 19:10:02 EST Ventricular Rate:  80 PR Interval:  132 QRS Duration: 72 QT Interval:  386 QTC Calculation: 445 R Axis:   84 Text Interpretation: Normal sinus rhythm Low voltage QRS Cannot rule out Anterior infarct , age undetermined Abnormal ECG Confirmed by Raeford Razor [phone]) on 08/16/2019 9:03:28 PM   Radiology No results found.  Procedures Procedures (including critical care time)  Medications Ordered in ED Medications - No data to display  ED Course  I have reviewed the triage vital signs and the nursing notes.  Pertinent labs & imaging results that were available during my care of the patient were reviewed by me and considered in my medical decision making (see chart for details).    MDM  Rules/Calculators/A&P                      39 year old female with palpitations/tachycardia.  Now resolved.  No additional symptoms.  EKG without overt concerning findings.  She is significantly stable.  Return precautions were discussed.  Consider Holter monitor if symptoms persist.    Final Clinical Impression(s) / ED Diagnoses Final diagnoses:  Palpitations    Rx / DC Orders ED Discharge Orders    None       Raeford Razor, MD 08/22/19 [phone]
